# Patient Record
Sex: Male | Born: 1965
Health system: Southern US, Community
[De-identification: ages and names within clinical notes are randomized; demographics above are authoritative.]

## PROBLEM LIST (undated history)

## (undated) DIAGNOSIS — N529 Male erectile dysfunction, unspecified: Secondary | ICD-10-CM

## (undated) DIAGNOSIS — E119 Type 2 diabetes mellitus without complications: Secondary | ICD-10-CM

## (undated) DIAGNOSIS — R809 Proteinuria, unspecified: Secondary | ICD-10-CM

## (undated) HISTORY — DX: Type 2 diabetes mellitus without complications: E11.9

## (undated) HISTORY — DX: Male erectile dysfunction, unspecified: N52.9

---
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
## 1993-11-12 HISTORY — PX: WRIST SURGERY: SHX841

## 1995-02-17 DIAGNOSIS — IMO0001 Reserved for inherently not codable concepts without codable children: Secondary | ICD-10-CM | POA: Insufficient documentation

## 2004-11-12 DIAGNOSIS — R809 Proteinuria, unspecified: Secondary | ICD-10-CM

## 2004-11-12 HISTORY — DX: Proteinuria, unspecified: R80.9

## 2008-11-12 HISTORY — PX: SHOULDER SURGERY: SHX246

## 2009-08-17 ENCOUNTER — Ambulatory Visit: Payer: Self-pay | Admitting: General Practice

## 2009-08-19 ENCOUNTER — Ambulatory Visit: Payer: Self-pay | Admitting: General Practice

## 2009-09-08 ENCOUNTER — Ambulatory Visit: Payer: Self-pay | Admitting: Orthopedic Surgery

## 2009-09-15 ENCOUNTER — Ambulatory Visit: Payer: Self-pay | Admitting: Orthopedic Surgery

## 2010-03-31 ENCOUNTER — Emergency Department: Payer: Self-pay

## 2015-01-26 ENCOUNTER — Ambulatory Visit: Payer: Self-pay | Admitting: Podiatry

## 2015-02-10 ENCOUNTER — Ambulatory Visit
Admit: 2015-02-10 | Disposition: A | Discharge status: Home / Self Care | Payer: Self-pay | Attending: General Practice | Admitting: General Practice

## 2015-02-11 ENCOUNTER — Ambulatory Visit
Admit: 2015-02-11 | Disposition: A | Discharge status: Home / Self Care | Payer: Self-pay | Attending: General Practice | Admitting: General Practice

## 2015-02-17 ENCOUNTER — Encounter: Payer: Self-pay | Admitting: General Surgery

## 2015-02-17 ENCOUNTER — Ambulatory Visit
Admit: 2015-02-17 | Disposition: A | Discharge status: Home / Self Care | Payer: Self-pay | Attending: General Surgery | Admitting: General Surgery

## 2015-02-17 ENCOUNTER — Other Ambulatory Visit: Payer: Self-pay | Admitting: General Surgery

## 2015-02-17 ENCOUNTER — Ambulatory Visit (INDEPENDENT_AMBULATORY_CARE_PROVIDER_SITE_OTHER): Payer: BLUE CROSS/BLUE SHIELD | Admitting: General Surgery

## 2015-02-17 VITALS — BP 130/70 | HR 80 | Resp 12 | Wt 190.0 lb

## 2015-02-17 DIAGNOSIS — E119 Type 2 diabetes mellitus without complications: Secondary | ICD-10-CM

## 2015-02-17 DIAGNOSIS — K819 Cholecystitis, unspecified: Secondary | ICD-10-CM

## 2015-02-17 DIAGNOSIS — Z794 Long term (current) use of insulin: Secondary | ICD-10-CM | POA: Diagnosis not present

## 2015-02-17 DIAGNOSIS — IMO0001 Reserved for inherently not codable concepts without codable children: Secondary | ICD-10-CM

## 2015-02-17 LAB — BASIC METABOLIC PANEL
Anion Gap: 7 (ref 7–16)
BUN: 17 mg/dL
CHLORIDE: 104 mmol/L
CREATININE: 1.07 mg/dL
Calcium, Total: 8.9 mg/dL
Co2: 29 mmol/L
EGFR (African American): >60
GLUCOSE: 77 mg/dL
POTASSIUM: 3.6 mmol/L
Sodium: 140 mmol/L

## 2015-02-17 LAB — CBC WITH DIFFERENTIAL/PLATELET
Basophil #: 0 10*3/uL (ref 0.0–0.1)
Basophil %: 0.1 %
EOS PCT: 4.8 %
Eosinophil #: 0.5 10*3/uL (ref 0.0–0.7)
HCT: 46.2 % (ref 40.0–52.0)
HGB: 15.7 g/dL (ref 13.0–18.0)
LYMPHS PCT: 30.5 %
Lymphocyte #: 2.9 10*3/uL (ref 1.0–3.6)
MCH: 31.1 pg (ref 26.0–34.0)
MCHC: 33.9 g/dL (ref 32.0–36.0)
MCV: 92 fL (ref 80–100)
MONOS PCT: 10.3 %
Monocyte #: 1 x10 3/mm (ref 0.2–1.0)
Neutrophil #: 5.2 10*3/uL (ref 1.4–6.5)
Neutrophil %: 54.3 %
Platelet: 218 10*3/uL (ref 150–440)
RBC: 5.05 10*6/uL (ref 4.40–5.90)
RDW: 13.6 % (ref 11.5–14.5)
WBC: 9.7 10*3/uL (ref 3.8–10.6)

## 2015-02-17 NOTE — Patient Instructions (Signed)

## 2015-02-17 NOTE — Progress Notes (Signed)
Patient ID: Craig Banks, male   DOB: 03/22/1966, 49 y.o.   MRN: [id number]  Chief Complaint  Patient presents with  . Other    Gallbladder    HPI Craig Banks is a 49 y.o. male here today for a evaluation for his gallbladder. Patient states he has been having abdominal pain for about 3 or 4 months. The pain is located in the right upper quadrant and last for 3 to 4 hours. He states he feels bloated all the time.   Abdomen ultrasound on 02/10/15 and HIDA on 02/11/15. EKG on 3/39/16 came back normal.   The patient reports that the pain may come shortly after meals or up to an hour later. He has been awakened from sleep at night with epigastric and right upper quadrant pain.  He's made use of Tums without any appreciable relief of symptoms. He does report increased belching and flatulence.. He has been somewhat constipated since making use of increasing amounts of calcium based antacids as well as Norco for pain.  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  He did report straining the left chest last week when he was pulling a riding lawnmower out of the ditch. HPI   Past Medical History  Diagnosis Date  . Diabetes mellitus without complication     age 55    Past Surgical History  Procedure Laterality Date  . Wrist surgery Right 1995  . Shoulder surgery Left 2010    No family history on file.  Social History History  Substance Use Topics  . Smoking status: Never Smoker   . Smokeless tobacco: Not on file  . Alcohol Use: 0.0 oz/week    0 Standard drinks or equivalent per week    No Known Allergies  Current Outpatient Prescriptions  Medication Sig Dispense Refill  . BAYER CONTOUR NEXT TEST test strip   11  . calcipotriene-betamethasone (TACLONEX) ointment   0  . Calcium Carbonate Antacid (TUMS CHEWY DELIGHTS PO) Take by mouth as needed.    . clotrimazole-betamethasone (LOTRISONE) lotion   0  . etodolac (LODINE) 500 MG tablet Take  500 mg by mouth.   1  . HUMALOG 100 UNIT/ML injection Inject 5 Units into the skin once.   12  . Hydrocodone-Acetaminophen 5-300 MG TABS as needed.   0  . LANTUS 100 UNIT/ML injection Inject 7 Units into the skin at bedtime.   12  . VIAGRA 100 MG tablet See admin instructions.  12   No current facility-administered medications for this visit.    Review of Systems Review of Systems  Respiratory: Positive for chest tightness. Negative for apnea, choking, shortness of breath, wheezing and stridor.   Cardiovascular: Negative.   Gastrointestinal: Positive for nausea, vomiting, abdominal pain and constipation. Negative for diarrhea, blood in stool and anal bleeding.    Blood pressure 130/70, pulse 80, resp. rate 12, weight 190 lb (86.183 kg).  Physical Exam Physical Exam  Constitutional: He is oriented to person, place, and time. He appears well-developed and well-nourished.  Eyes: Conjunctivae are normal. No scleral icterus.  Neck: Neck supple.  Cardiovascular: Normal rate, regular rhythm and normal heart sounds.   Pulmonary/Chest: Effort normal and breath sounds normal.  Abdominal: Soft. Normal appearance and bowel sounds are normal. There is tenderness in the epigastric area. No hernia.  Lymphadenopathy:    He has no cervical adenopathy.  Neurological: He is alert and oriented to person, place, and time.  Skin: Skin is  warm and dry.    Data Reviewed Laboratory studies dated 04/15/2014 showed a hemoglobin of 15.7, white blood cell count 6400. TSH 6.14.  Normal electrolytes, normal liver function studies. Hemoglobin A1c 8.1.  ECG dated February 08, 2015 was normal.   Abdominal ultrasound dated 02/10/2015 was unremarkable.  HIDA scan with CCK injection completed 02/11/2015 showed an ejection fraction 18%. The patient did experience abdominal pain during the CCK injection.  Assessment    Symptoms compatible with chronic cholecystitis.    Plan    The patient has been diabetic  for 20 years, but his clinical history is not suggestive of gastric outlet obstruction or gastroparesis. His reported nocturnal pain and reproduction of symptoms during CCK point strongly towards a biliary source for his discomfort.  The possibility that pain may not be relieved after surgery was reviewed.    Laparoscopic Cholecystectomy with Intraoperative Cholangiogram. The procedure, including it's potential risks and complications (including but not limited to infection, bleeding, injury to intra-abdominal organs or bile ducts, bile leak, poor cosmetic result, sepsis and death) were discussed with the patient in detail. Non-operative options, including their inherent risks (acute calculous cholecystitis with possible choledocholithiasis or gallstone pancreatitis, with the risk of ascending cholangitis, sepsis, and death) were discussed as well. The patient expressed and understanding of what we discussed and wishes to proceed with laparoscopic cholecystectomy. The patient further understands that if it is technically not possible, or it is unsafe to proceed laparoscopically, that I will convert to an open cholecystectomy.    PCP:  Leigh AuroraStrickland, James  Byrnett, Jeffrey W 02/17/2015, 10:36 AM

## 2015-02-18 ENCOUNTER — Ambulatory Visit
Admit: 2015-02-18 | Disposition: A | Discharge status: Home / Self Care | Payer: Self-pay | Attending: General Surgery | Admitting: General Surgery

## 2015-02-18 DIAGNOSIS — K8010 Calculus of gallbladder with chronic cholecystitis without obstruction: Secondary | ICD-10-CM | POA: Diagnosis not present

## 2015-02-18 HISTORY — PX: CHOLECYSTECTOMY: SHX55

## 2015-02-21 ENCOUNTER — Encounter: Payer: Self-pay | Admitting: General Surgery

## 2015-02-22 ENCOUNTER — Encounter: Payer: Self-pay | Admitting: General Surgery

## 2015-03-01 ENCOUNTER — Ambulatory Visit (INDEPENDENT_AMBULATORY_CARE_PROVIDER_SITE_OTHER): Payer: BLUE CROSS/BLUE SHIELD | Admitting: General Surgery

## 2015-03-01 ENCOUNTER — Encounter: Payer: Self-pay | Admitting: General Surgery

## 2015-03-01 VITALS — BP 112/86 | HR 92 | Resp 16 | Ht 67.0 in | Wt 191.0 lb

## 2015-03-01 DIAGNOSIS — K819 Cholecystitis, unspecified: Secondary | ICD-10-CM

## 2015-03-01 NOTE — Patient Instructions (Addendum)
The patient is aware to call back for any questions or concerns. 2 Aleve twice a day as needed

## 2015-03-01 NOTE — Progress Notes (Signed)
Patient ID: Craig Banks, male   DOB: Mar 14, 1966, 49 y.o.   MRN: [id number]  Chief Complaint  Patient presents with  . Routine Post Op    gallbladder    HPI Craig Banks is a 49 y.o. male.  Here today for postoperative visit, laparoscopic cholecystectomy on 02-18-15. States he is doing well. Mild constipation, dulcolax has helped.   HPI  Past Medical History  Diagnosis Date  . Diabetes mellitus without complication     age 49    Past Surgical History  Procedure Laterality Date  . Wrist surgery Right 1995  . Shoulder surgery Left 2010  . Cholecystectomy  02/18/15    No family history on file.  Social History History  Substance Use Topics  . Smoking status: Never Smoker   . Smokeless tobacco: Not on file  . Alcohol Use: 0.0 oz/week    0 Standard drinks or equivalent per week    No Known Allergies  Current Outpatient Prescriptions  Medication Sig Dispense Refill  . BAYER CONTOUR NEXT TEST test strip   11  . bisacodyl (DULCOLAX) 5 MG EC tablet Take 5 mg by mouth daily as needed for moderate constipation.    . calcipotriene-betamethasone (TACLONEX) ointment   0  . Calcium Carbonate Antacid (TUMS CHEWY DELIGHTS PO) Take by mouth as needed.    . clotrimazole-betamethasone (LOTRISONE) lotion   0  . HUMALOG 100 UNIT/ML injection Inject 5 Units into the skin once.   12  . LANTUS 100 UNIT/ML injection Inject 7 Units into the skin at bedtime.   12  . VIAGRA 100 MG tablet See admin instructions.  12   No current facility-administered medications for this visit.    Review of Systems Review of Systems  Constitutional: Negative.   Respiratory: Negative.   Cardiovascular: Negative.     Blood pressure 112/86, pulse 92, resp. rate 16, height 5\' 7"  (1.702 m), weight 191 lb (86.637 kg).  Physical Exam Physical Exam  Constitutional: He is oriented to person, place, and time. He appears well-developed and well-nourished.  Neck: Neck supple.  Cardiovascular: Normal  rate, regular rhythm and normal heart sounds.   Pulmonary/Chest: Effort normal and breath sounds normal.  Abdominal: Soft. Normal appearance. There is no tenderness.  Incisions well healed.  Lymphadenopathy:    He has no cervical adenopathy.  Neurological: He is alert and oriented to person, place, and time.  Skin: Skin is warm and dry.    Data Reviewed Pathology showed chronic cholecystitis.  Assessment    Doing well status post cholecystectomy.  Symptomatic left rib discomfort.    Plan    The patient had MG his chest wall by pulling a lawn tractor out of the ditch prior to surgery. The discomfort is improving but is still noticeable.  He may benefit from a rib belt, but certainly local heat and oral anti-inflammatories will be of benefit.    2 Aleve twice a day as needed for left rib discomfort. Proper lifting technique demonstrated.   We will plan for a return to work on 03/07/2015. PCP:  Leigh AuroraStrickland, James   Shalaunda Weatherholtz W 03/01/2015, 9:49 AM

## 2015-03-07 LAB — SURGICAL PATHOLOGY

## 2015-03-13 NOTE — Op Note (Signed)
PATIENT NAME:  Craig HazardMUNDY, Darris T MR#:  [id number] DATE OF BIRTH:  1966-07-08  DATE OF PROCEDURE:  02/18/2015  PREOPERATIVE DIAGNOSIS: Chronic cholecystitis.   POSTOPERATIVE DIAGNOSIS: Chronic cholecystitis.   OPERATIVE PROCEDURE: Laparoscopic cholecystectomy with intraoperative cholangiograms.   SURGEON: Earline MayotteJeffrey W. Ellieanna Funderburg, MD   ANESTHESIA: General endotracheal under Yves DillPaul Carroll, MD.   ESTIMATED BLOOD LOSS: Less than 5 mL.   CLINICAL NOTE: This 49 year old male has had a several-week history of postprandial pain and nocturnal pain. Ultrasound was negative, but a HIDA scan showed a markedly depressed ejection fraction of 19% with reproduction of his symptoms. Plain films of the chest and abdomen showed no obvious pathology and no evidence of gastric distention (a possibility with a 20-year history of reported diabetes). With an otherwise unremarkable clinical history and presentation, he was felt to be a candidate for cholecystectomy.   OPERATIVE NOTE: With the patient under adequate general endotracheal anesthesia, the abdomen was prepped with ChloraPrep and draped. In Trendelenburg position, a Veress needle was placed through a transumbilical incision. After assuring intra-abdominal location with the hanging drop test, a 12 mm step port was expanded and inspection showed no evidence of injury from initial port placement. Examination of the abdomen showed minimal inflammation in the gallbladder. No abnormality of the visualized portion of the liver, stomach, small intestine, colon, or anterior abdominal wall. No free fluid.   An 11 mm XL port was placed in the epigastrium and two 5-mm step ports were placed laterally under direct vision. The gallbladder was placed on cephalad traction. The neck of the gallbladder was cleared. The cystic duct and cystic artery were immediately adjacent to one another. The Kumar clamp was placed and fluoroscopic cholangiograms completed using 11 mL of one-half  strength Conray 60. This showed prompt filling of the entire biliary tree and free flow into the duodenum and partial reflux into the pancreatic duct. The cystic duct and cystic artery were individually clipped and divided. The gallbladder was then removed from the liver bed making use of hook cautery dissection. It was delivered through the umbilical port site. Inspection showed slight prominence of the mucosa, but no evidence of stones. It was sent in formalin for routine histology. The right upper quadrant was irrigated with lactated Ringer solution. Inspection from the epigastric site showed no evidence of injury from initial port placement. The abdomen was then desufflated and ports removed under direct vision. The fascia of the umbilicus was approximated with an 0 Vicryl figure-of-eight suture. Skin incisions were closed with 4-0 Vicryl subcuticular sutures. Benzoin, Steri-Strips, Telfa, and Tegaderm dressing were applied.  The patient tolerated the procedure well and was taken to the recovery room in stable condition.   ____________________________ Earline MayotteJeffrey W. Gail Creekmore, MD jwb:ST D: 02/18/2015 20:40:31 ET T: 02/19/2015 02:17:31 ET JOB#: [id number]  cc: Earline MayotteJeffrey W. Yetunde Leis, MD, <Dictator> Quita SkyeJames D. Dorothey BasemanStrickland, MD Amalie Koran Brion AlimentW Keiron Iodice MD ELECTRONICALLY SIGNED 02/21/2015 10:38

## 2015-04-30 IMAGING — CR DG ABDOMEN 1V
1 series · 1 of 1 positions shown · non-contrast
Comparison: None.

CLINICAL DATA: Diabetes.  Gallbladder surgery.

EXAM:
ABDOMEN - 1 VIEW

[dxr abdomen ap only]
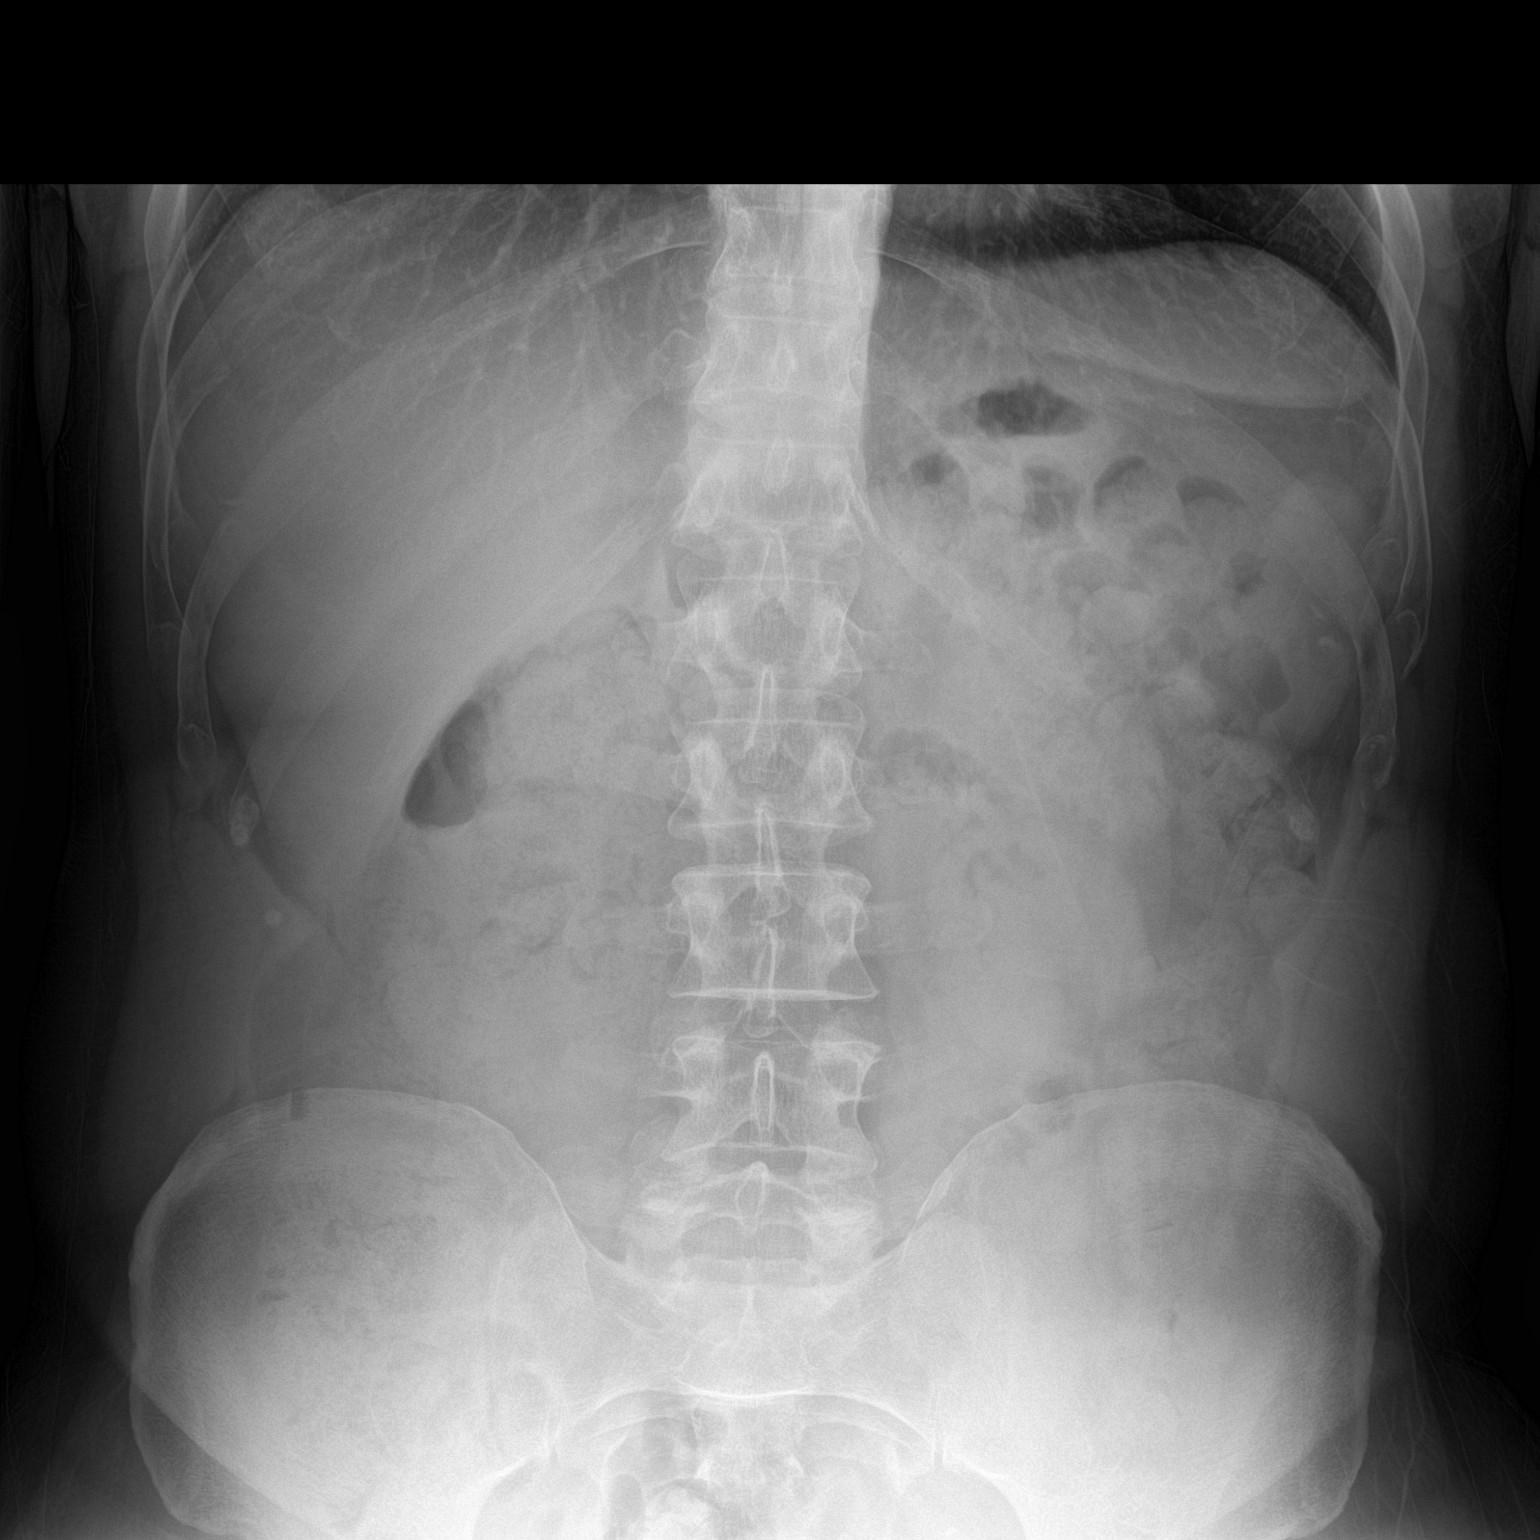

[1 of 1 positions shown; findings below may reference images not displayed]

FINDINGS: Soft tissue structures are unremarkable. No bowel distention. Large
amount stool noted throughout the colon. No free air. No acute bony
abnormality.
IMPRESSION: Large amount of stool noted throughout the colon suggesting
constipation. Exam is otherwise unremarkable.

## 2015-06-09 ENCOUNTER — Other Ambulatory Visit: Payer: Self-pay | Admitting: Physician Assistant

## 2019-05-14 ENCOUNTER — Other Ambulatory Visit: Payer: Self-pay

## 2019-05-14 ENCOUNTER — Ambulatory Visit: Payer: 59

## 2019-05-14 DIAGNOSIS — Z0000 Encounter for general adult medical examination without abnormal findings: Secondary | ICD-10-CM

## 2019-05-14 LAB — POCT URINALYSIS DIPSTICK
Bilirubin, UA: NEGATIVE
Blood, UA: NEGATIVE
Glucose, UA: NEGATIVE
Ketones, UA: NEGATIVE
Leukocytes, UA: NEGATIVE
Nitrite, UA: NEGATIVE
Protein, UA: POSITIVE — AB
Spec Grav, UA: 1.025 (ref 1.010–1.025)
Urobilinogen, UA: 1 E.U./dL
pH, UA: 6 (ref 5.0–8.0)

## 2019-05-15 LAB — CMP12+LP+TP+TSH+6AC+PSA+CBC…
ALT: 23 IU/L (ref 0–44)
Albumin/Globulin Ratio: 1.8 (ref 1.2–2.2)
Albumin: 4.2 g/dL (ref 3.8–4.9)
Alkaline Phosphatase: 79 IU/L (ref 39–117)
BUN/Creatinine Ratio: 21 — ABNORMAL HIGH (ref 9–20)
BUN: 24 mg/dL (ref 6–24)
Basophils Absolute: 0 10*3/uL (ref 0.0–0.2)
Basos: 0 %
Bilirubin Total: 0.5 mg/dL (ref 0.0–1.2)
Calcium: 9 mg/dL (ref 8.7–10.2)
Chloride: 105 mmol/L (ref 96–106)
Chol/HDL Ratio: 4.9 ratio (ref 0.0–5.0)
Cholesterol, Total: 230 mg/dL — ABNORMAL HIGH (ref 100–199)
Creatinine, Ser: 1.14 mg/dL (ref 0.76–1.27)
EOS (ABSOLUTE): 0.9 10*3/uL — ABNORMAL HIGH (ref 0.0–0.4)
Eos: 11 %
Estimated CHD Risk: 1 times avg. (ref 0.0–1.0)
Free Thyroxine Index: 2 (ref 1.2–4.9)
GFR calc Af Amer: 84 mL/min/{1.73_m2} (ref >59)
GFR calc non Af Amer: 73 mL/min/{1.73_m2} (ref >59)
GGT: 47 IU/L (ref 0–65)
Globulin, Total: 2.3 g/dL (ref 1.5–4.5)
Glucose: 131 mg/dL — ABNORMAL HIGH (ref 65–99)
HDL: 47 mg/dL (ref >39)
Hematocrit: 44.1 % (ref 37.5–51.0)
Hemoglobin: 15.4 g/dL (ref 13.0–17.7)
Immature Grans (Abs): 0 10*3/uL (ref 0.0–0.1)
Immature Granulocytes: 0 %
Iron: 64 ug/dL (ref 38–169)
LDH: 186 IU/L (ref 121–224)
LDL Calculated: 160 mg/dL — ABNORMAL HIGH (ref 0–99)
Lymphocytes Absolute: 1.9 10*3/uL (ref 0.7–3.1)
Lymphs: 24 %
MCH: 30.8 pg (ref 26.6–33.0)
MCHC: 34.9 g/dL (ref 31.5–35.7)
Monocytes Absolute: 0.8 10*3/uL (ref 0.1–0.9)
Monocytes: 10 %
Neutrophils Absolute: 4.5 10*3/uL (ref 1.4–7.0)
Neutrophils: 55 %
Phosphorus: 4.4 mg/dL — ABNORMAL HIGH (ref 2.8–4.1)
Platelets: 226 10*3/uL (ref 150–450)
Prostate Specific Ag, Serum: 0.7 ng/mL (ref 0.0–4.0)
RBC: 5 x10E6/uL (ref 4.14–5.80)
RDW: 13 % (ref 11.6–15.4)
Sodium: 143 mmol/L (ref 134–144)
T3 Uptake Ratio: 27 % (ref 24–39)
T4, Total: 7.3 ug/dL (ref 4.5–12.0)
TSH: 3 u[IU]/mL (ref 0.450–4.500)
Total Protein: 6.5 g/dL (ref 6.0–8.5)
Triglycerides: 114 mg/dL (ref 0–149)
Uric Acid: 4.3 mg/dL (ref 3.7–8.6)
VLDL Cholesterol Cal: 23 mg/dL (ref 5–40)
WBC: 8.2 10*3/uL (ref 3.4–10.8)

## 2019-05-15 LAB — CMP12+LP+TP+TSH+6AC+PSA+CBC?
AST: 18 IU/L (ref 0–40)
MCV: 88 fL (ref 79–97)
Potassium: 4.4 mmol/L (ref 3.5–5.2)

## 2019-05-15 LAB — MICROALBUMIN / CREATININE URINE RATIO
Creatinine, Urine: 219.9 mg/dL
Microalb/Creat Ratio: 4 mg/g creat (ref 0–29)
Microalbumin, Urine: 7.7 ug/mL

## 2019-05-15 LAB — HGB A1C W/O EAG: Hgb A1c MFr Bld: 8 % — ABNORMAL HIGH (ref 4.8–5.6)

## 2019-05-21 ENCOUNTER — Other Ambulatory Visit: Payer: Self-pay

## 2019-05-21 ENCOUNTER — Ambulatory Visit: Payer: Self-pay | Admitting: Internal Medicine

## 2019-05-21 ENCOUNTER — Encounter: Payer: Self-pay | Admitting: Internal Medicine

## 2019-05-21 VITALS — BP 129/87 | HR 107 | Temp 97.2°F | Resp 16 | Ht 67.5 in | Wt 209.0 lb

## 2019-05-21 DIAGNOSIS — E109 Type 1 diabetes mellitus without complications: Secondary | ICD-10-CM

## 2019-05-21 DIAGNOSIS — E7849 Other hyperlipidemia: Secondary | ICD-10-CM

## 2019-05-21 NOTE — Progress Notes (Signed)
S  - 53 y.o. male with  IDDM who presents for annual physical evaluation. He notes his sugars have been a little higher in recent past on checks, does check his sugars several times daily. Taking 15/7 of insulin in am, and 15/5 pre-supper  Admits not as strict with his diet recently, and stated can get his A1C back in the 7 range with more exercise and diet mod's when talked about adjusting his medication/dose to do so. He has not been exercising as much recent past with gyms closed and Covid restrictions.  No specific complaints, denies any recent CP, palpitations, SOB (except with wearing mask noted), abdominal pains, change in bowel habits and does note he often has to have a BM shortly after eating, no blood or dark/black stools, no vision changes, no recent fevers or sx's concerning for Covid, up once overnight at most to urinate,no hesitancy or frequency. No LE swelling,no numbness or tingling in legs or feet.   No tob hx  Meds reviewed - not taking Lipitor (prescribed in March for him at our visit, not start yet) Current Outpatient Medications on File Prior to Visit  Medication Sig Dispense Refill  . atorvastatin (LIPITOR) 10 MG tablet Take 10 mg by mouth at bedtime.    Marland Kitchen BAYER CONTOUR NEXT TEST test strip   11  . BD INSULIN SYRINGE U/F 31G X 5/16" 0.3 ML MISC USE AS DIRECTED   UP TO 6 INJECTIONS DAILY    . bisacodyl (DULCOLAX) 5 MG EC tablet Take 5 mg by mouth daily as needed for moderate constipation.    . calcipotriene-betamethasone (TACLONEX) ointment   0  . Calcium Carbonate Antacid (TUMS CHEWY DELIGHTS PO) Take by mouth as needed.    . clotrimazole-betamethasone (LOTRISONE) lotion   0  . Continuous Blood Gluc Sensor (FREESTYLE LIBRE 14 DAY SENSOR) MISC See admin instructions.    Marland Kitchen HUMALOG 100 UNIT/ML injection Inject 5 Units into the skin once.   12  . insulin glargine (LANTUS) 100 UNIT/ML injection Inject into the skin.    Marland Kitchen LANTUS 100 UNIT/ML injection Inject 7 Units into the  skin at bedtime.   12  . VIAGRA 100 MG tablet See admin instructions.  12   No current facility-administered medications on file prior to visit.     No Known Allergies  Social History   Tobacco Use  Smoking Status Never Smoker  Smokeless Tobacco Never Used    FH - No colon CA, S with diabetes  O - NAD, masked, sweaty, HR up some and he questioned if his sugar may be a little low (a carb drink then given in the office and felt better), asked about checking his BS and he noted not needed  BP 129/87 (BP Location: Right Arm, Patient Position: Sitting, Cuff Size: Normal)   Pulse (!) 107   Temp (!) 97.2 F (36.2 C) (Oral)   Resp 16   Ht 5' 7.5" (1.715 m)   Wt 209 lb (94.8 kg)   SpO2 95%   BMI 32.25 kg/m    Weight - 202 - May 2019  HEENT - sclera anicteric, PERRL, EOMI, conj - non-inj'ed, L EAC with mild cerumen, R EAC less cerumen and TM's clear (harder to completely visualize on L with cerumen) Neck - supple, no adenopathy, no TM, carotids without bruits Car - RRR without m/g/r Pulm- CTA without wheeze or rales Abd - soft, NT, ND, BS+, no obvious HSM, no masses Back - no CVA tenderness Ext -  no LE edema, no active joints GU - no swelling in inguinal/suprapubic region, NT, testicle and prostate exams deferred (without concerning sx's and after discussion on current recommendations for prostate CA screening including PSA tests) Neuro - affect was not flat, appropriate with conversation  Grossly non-focal with good strength on testing, sensation intact to LT in distal extremities, DTR's 2+ and = patella, Romberg neg, no pronator drift, good balance on one foot, good finger to nose   Monofilament testing of feet - neg  Labs reviewed - A1C - 8.0 (7.4 in March, 8.4-10/2018), TC - 230, LDL -160, PSA - 0.7, urine microalb - neg,  ECG reviewed - no concerning changes from prior ECG Colonoscopy screening discussed - not yet done ("thinking about it" noted last PE)  Ass/Plan 1.A/P -  IDDM - fairly well controlled (A1C was better in March,noted not as strict diet and less exercise since)  Continue current medications Need to start the statin with goal to keep LDL <70, and he noted he would Importance of diet modifications and regular aerobic exercise encouraged Above to help with weight control/weight loss also important (and not continue to gain weight) Continue to monitor blood sugars as doing Discussed why important to keep sugars very well controlled to help protect the heart, kidneys, and lessen future complications. Also not to have BS's below 50 as he noted he has had that happen in the past, as low as in the 30's and informed him that is dangerous and important to never get that low. Noted the pumps available and how they can be helpful in management and a consideration.  Needs routine eye examinations and recommended following up for regular assessments and importance of that.   Consider ACE in future, microalb negative and BP borderline readings. Would like BP closer to 120/80 and adding the Lipitor today was emphasized and over time, hope to add an ACE to help   2.  Hyperlipidemia - LDL and TC high  Educated on the role of medications, risk/benefits and a statin again encouraged and he noted he will start - 10mg  dose Importance of diet modifications also helpful Importance of some regular aerobic exercise encouraged, as helpful with this also Above to help with weight control/weight loss also very important   3. More sedentary recent past - increase activity rec'ed as above  4. Screening - rec'ed colonoscopy as over age 53 and is screening. He noted he has never had any concerns for this and hesitant. If he decides to pursue, will let us know and can help arrange.   F/u in 3 months, sooner prn

## 2019-07-03 ENCOUNTER — Other Ambulatory Visit: Payer: Self-pay | Admitting: Internal Medicine

## 2019-07-17 ENCOUNTER — Telehealth: Payer: Self-pay

## 2019-07-17 NOTE — Telephone Encounter (Signed)
Received prior authorization from [facility] for Lantus.  Contacted [employer] at [phone] & spoke with Jeannetta Nap.  Information given as requested.  States she's forwarding the request to a pharmacist for review & Holland Falling will notify us in 24 - 48 business hours.  AMD

## 2019-07-21 MED ORDER — BASAGLAR KWIKPEN 100 UNIT/ML ~~LOC~~ SOPN: 70.0000 [IU] | PEN_INJECTOR | Freq: Every day | SUBCUTANEOUS | 0 refills | Status: DC

## 2019-07-21 NOTE — Telephone Encounter (Signed)
[employer] faxed a denial letter for the prior authorization request for Craig Banks to remain on Lantus.  Letter says formulary alternatives are:  Health and safety inspector.    Please advise.  AMD

## 2019-07-21 NOTE — Telephone Encounter (Signed)
appt scheudled

## 2019-07-21 NOTE — Telephone Encounter (Signed)
Craig Banks,  Could you help with the transition to the equivalent dose of Levmir from Lantus as we did recently with another patient.  Thanks

## 2019-07-21 NOTE — Telephone Encounter (Signed)
Called and spoke to pt he would like Basaglar since its most like Lantus. I have sent rx looks like he should have had a physical in May but didn't have it. He will need physical scheduled and labs prior. Jess will call pt to schedule

## 2019-07-23 ENCOUNTER — Other Ambulatory Visit: Payer: Self-pay

## 2019-07-23 ENCOUNTER — Other Ambulatory Visit: Payer: Self-pay | Admitting: Internal Medicine

## 2019-07-23 MED ORDER — INSULIN ASPART 100 UNIT/ML ~~LOC~~ SOLN
SUBCUTANEOUS | 11 refills | Status: DC
Start: 2019-07-23 — End: 2020-03-08

## 2019-07-23 MED ORDER — FREESTYLE FREEDOM LITE W/DEVICE KIT: PACK | 0 refills | Status: DC

## 2019-07-23 MED ORDER — GLUCOSE BLOOD VI STRP: ORAL_STRIP | 12 refills | Status: DC

## 2019-07-27 ENCOUNTER — Other Ambulatory Visit: Payer: Self-pay

## 2019-07-27 ENCOUNTER — Telehealth: Payer: Self-pay

## 2019-07-27 DIAGNOSIS — E109 Type 1 diabetes mellitus without complications: Secondary | ICD-10-CM

## 2019-07-27 DIAGNOSIS — E1069 Type 1 diabetes mellitus with other specified complication: Secondary | ICD-10-CM

## 2019-07-27 MED ORDER — INSULIN DETEMIR 100 UNIT/ML ~~LOC~~ SOLN: SUBCUTANEOUS | 5 refills | Status: DC

## 2019-07-27 NOTE — Telephone Encounter (Signed)
Contacted Craig Banks to let him know Lantus changed to Levemir & prescription electronically sent to pharmacy.  AMD

## 2019-08-20 ENCOUNTER — Encounter: Payer: Self-pay | Admitting: Physician Assistant

## 2019-08-20 ENCOUNTER — Other Ambulatory Visit: Payer: Self-pay

## 2019-08-20 ENCOUNTER — Ambulatory Visit: Payer: 59 | Admitting: Physician Assistant

## 2019-08-20 VITALS — BP 110/81 | HR 98 | Temp 97.9°F | Resp 16 | Ht 68.0 in | Wt 205.0 lb

## 2019-08-20 DIAGNOSIS — E109 Type 1 diabetes mellitus without complications: Secondary | ICD-10-CM

## 2019-08-20 NOTE — Progress Notes (Signed)
Subjective:    Patient ID: Craig Banks, male    DOB: August 28, 1966, 53 y.o.   MRN: [id number]  HPI  3 mo F/U for 31 up M DM I  Diagnosed at 40 yo- sister ,mom and gramma are diabetic. Son at 11yo negative so far.   Slightly stressed right now because new insurance company is requiring changes in some of his med supplies Self-admitted "creature of habit"- reassured that it is commonly difficult to face such changes as he has been using varying doses of his current drugs for over 20 years.  Per insurance company Lantus should become Levamir and Humalog become Novalog.   He actually has supplies of previous Rx at home and wishes to use them until depleted because his [employer]ene[employer] has expired for the year would mean added expense to change now.  Today on arrival his BS was 38.Marland KitchenMarland Kitchen"I felt it dropping coming up the stairs". He was given chocolate candy . He did eat lunch but it is currently snack time and his apple and fruit cup are in the car. Reminded with his history is imperative to keep candy  packet in his pocket... "They call them lifesavers for a reason".  Discussed management with a somewhat wider window of control. He has had it as low as mid-up 20's and this is strongly discouraged.  He has recently suddenly cut out all his diet soft drinks and cut meals back to try to lose weight. Has not monitored morning glucose nor done any randoms   He reports aweight  high of 208-209 and has lost to 202 now and generally feels good  Elevated cholesterol but refuses statin   Realizes he should have colonoscopy but agrees to proceed.Prefers not to schedule with Lelon Frohlich today but agrees to do so in near future.  Review of Systems Labs reviewed    Objective:   Physical Exam Constitutional:      General: He is not in acute distress.    Appearance: Normal appearance. He is obese. He is not ill-appearing.  HENT:     Mouth/Throat:     Mouth: Mucous membranes are moist.  Eyes:     Extraocular  Movements: Extraocular movements intact.  Neck:     Musculoskeletal: Normal range of motion.  Cardiovascular:     Rate and Rhythm: Normal rate.  Pulmonary:     Effort: Pulmonary effort is normal.  Musculoskeletal: Normal range of motion.  Skin:    General: Skin is warm and dry.  Neurological:     Mental Status: He is alert.  Psychiatric:        Mood and Affect: Mood normal.        Behavior: Behavior normal.        VS reviewed   205 at 5'8"         Assessment & Plan:  Has had a Libre unit ordered in the past but apparently doesn't use it - and is encouraged that it would help with closer monitoring of "of the moment"  glucose levels. Has been offered and ordered a Freestyle unit ,as well as pen dosing systems which he has deferred...encouraged to consider that many if the upgraded systems help make disease management much easier and less cumbersome. Counselled re the need for re-focus on DM management-- after 24 years he has grown tired of it all-- but this can have dangerous repercussions.  He was willing to discuss the need for a screening colonoscopy - but not willing to be put  on the referral list for a call back to schedule same  Dietary focus with healthy choices and appropriate servings--  daily exercise--15 min brisk walk, increasing to 30 then 40 min EVERY day -  Home glucose checks for more accurate understanding of his day intake and exercise  RTC in 1 month for eight check and re-rval--if stable can move back to 3 mos DM checks to follow

## 2019-08-20 NOTE — Progress Notes (Signed)
Physical was 05/2019 with Dr. Roxan Hockey  A1c Now = 7.9  NFBS = 53 Chocolate candy given to help bring up. Said he could tell it was low because he was sweating.

## 2019-09-14 DIAGNOSIS — Z20828 Contact with and (suspected) exposure to other viral communicable diseases: Secondary | ICD-10-CM | POA: Diagnosis not present

## 2019-10-21 DIAGNOSIS — Z20828 Contact with and (suspected) exposure to other viral communicable diseases: Secondary | ICD-10-CM | POA: Diagnosis not present

## 2019-11-17 ENCOUNTER — Other Ambulatory Visit: Payer: Self-pay

## 2019-11-26 ENCOUNTER — Ambulatory Visit: Payer: Self-pay

## 2019-12-02 ENCOUNTER — Other Ambulatory Visit: Payer: Self-pay

## 2019-12-02 ENCOUNTER — Other Ambulatory Visit: Payer: 59

## 2019-12-02 DIAGNOSIS — E109 Type 1 diabetes mellitus without complications: Secondary | ICD-10-CM

## 2019-12-02 NOTE — Progress Notes (Signed)
Scheduled to review lab results 12/09/2019 with Ron Smith, PA-C (Interim Provider).  AMD 

## 2019-12-03 LAB — BUN+CREAT
BUN/Creatinine Ratio: 14 (ref 9–20)
BUN: 15 mg/dL (ref 6–24)
Creatinine, Ser: 1.08 mg/dL (ref 0.76–1.27)
GFR calc Af Amer: 90 mL/min/{1.73_m2} (ref >59)
GFR calc non Af Amer: 78 mL/min/{1.73_m2} (ref >59)

## 2019-12-03 LAB — HGB A1C W/O EAG: Hgb A1c MFr Bld: 7.8 % — ABNORMAL HIGH (ref 4.8–5.6)

## 2019-12-07 ENCOUNTER — Other Ambulatory Visit: Payer: Self-pay | Admitting: Internal Medicine

## 2019-12-08 ENCOUNTER — Encounter: Payer: Self-pay | Admitting: Internal Medicine

## 2019-12-08 NOTE — Telephone Encounter (Signed)
Last labs Jan 2021 HgbA1c 7.8 appt with [facility] provider tomorrow pending.  Electronic Rx for refill of libre glucose monitoring supplies sent to his pharmacy of choice.

## 2019-12-09 ENCOUNTER — Other Ambulatory Visit: Payer: Self-pay

## 2019-12-09 ENCOUNTER — Telehealth: Payer: Self-pay | Admitting: Registered Nurse

## 2019-12-09 ENCOUNTER — Ambulatory Visit: Payer: 59 | Admitting: Physician Assistant

## 2019-12-09 VITALS — BP 120/86 | HR 103 | Temp 98.6°F | Ht 68.0 in | Wt 207.4 lb

## 2019-12-09 DIAGNOSIS — E109 Type 1 diabetes mellitus without complications: Secondary | ICD-10-CM

## 2019-12-09 MED ORDER — GLUCOSE BLOOD VI STRP: ORAL_STRIP | 12 refills | Status: DC

## 2019-12-09 NOTE — Telephone Encounter (Signed)
Thayer Ohm is scheduled to see Ron today at 3:15 pm for 3 month DM follow-up.  Contacted [facility] & it would cost approximately $204.00/month for the Freestyle 14 Day Sensors - 1 sensor per box & 2 boxes per month.  [facility] doesn't accept the GoodRx card for this - name brand product.    AMD

## 2019-12-09 NOTE — Telephone Encounter (Signed)
Spoke to [employer] and patient has to fail using dexcom G6 system before another system would be approved.  Please call and ask patient if he prefers to cash pay for freestyle libre 14 day reader or if I should enter new Rx for new meter for him.

## 2019-12-09 NOTE — Progress Notes (Signed)
   Subjective: Medication refill    Patient ID: Craig Banks, male    DOB: Aug 17, 1966, 54 y.o.   MRN: [id number]  HPI Patient presents for hemoglobin A1c results and medication refill test strips.  Patient state he is having problems having insurance company approved the [employer] monitoring sensors.  Patient requested refill strips when he pursue other avenues to get the freestyle [place] monitor sensor.   Review of Systems Diabetes and hyperlipidemia.    Objective:   Physical Exam  Deferred      Assessment & Plan:  Discussed lab results with patient showing hemoglobin A1c has decreased from 8.0-7.8.  Patient advised continue previous medications and a prescription for test strips were sent to the pharmacy.  Advised to follow-up in 6 months.

## 2019-12-10 ENCOUNTER — Telehealth: Payer: Self-pay

## 2019-12-10 NOTE — Telephone Encounter (Signed)
Erroneous entry  AMD 

## 2019-12-13 NOTE — Telephone Encounter (Signed)
noted 

## 2019-12-17 ENCOUNTER — Telehealth: Payer: Self-pay

## 2019-12-17 MED ORDER — FREESTYLE LIBRE 14 DAY SENSOR MISC: 2 refills | Status: DC

## 2019-12-17 NOTE — Telephone Encounter (Signed)
Patient called and states the glucose sensor was sent to [facility] and they do not accept the goodrx card. Patient requested rx to be sent to [facility]. Sent refill to new pharmacy. Original rx was sent to [facility] on 12/08/2019.

## 2019-12-17 NOTE — Telephone Encounter (Signed)
Noted agree with plan of care Rx signed for freestyle libre 14 day sensors.

## 2020-03-08 ENCOUNTER — Other Ambulatory Visit: Payer: Self-pay

## 2020-03-08 ENCOUNTER — Ambulatory Visit: Payer: Self-pay | Admitting: Physician Assistant

## 2020-03-08 ENCOUNTER — Encounter: Payer: Self-pay | Admitting: Physician Assistant

## 2020-03-08 VITALS — BP 113/83 | HR 94 | Temp 98.1°F | Resp 16 | Ht 67.5 in | Wt 209.0 lb

## 2020-03-08 DIAGNOSIS — Z0184 Encounter for antibody response examination: Secondary | ICD-10-CM

## 2020-03-08 DIAGNOSIS — E109 Type 1 diabetes mellitus without complications: Secondary | ICD-10-CM

## 2020-03-08 LAB — POCT GLYCOSYLATED HEMOGLOBIN (HGB A1C): Hemoglobin A1C: 7.7 % — AB (ref 4.0–5.6)

## 2020-03-08 MED ORDER — INSULIN ASPART 100 UNIT/ML ~~LOC~~ SOLN: SUBCUTANEOUS | 11 refills | Status: DC

## 2020-03-08 MED ORDER — GLUCOSE BLOOD VI STRP: ORAL_STRIP | 12 refills | Status: AC

## 2020-03-08 MED ORDER — INSULIN DETEMIR 100 UNIT/ML ~~LOC~~ SOLN: SUBCUTANEOUS | 5 refills | Status: DC

## 2020-03-08 NOTE — Progress Notes (Signed)
   Subjective: Diabetes    Patient ID: Craig Banks, male    DOB: 08/08/1966, 54 y.o.   MRN: [id number]  HPI Patient presents for medication refill of labetalol, Levemir, and freestyle glucose test strips.  Patient states still having issues trying to obtain the freestyle libre sensor and reader.   Review of Systems     Diabetes and hyperlipidemia. Objective:   Physical Exam Hemoglobin A1c today was 7.7.       Assessment & Plan: Medication refill  Patient prescriptions for Lovenox, Levemir, test for strep today.  Patient advised follow-up in 3 months.

## 2020-03-08 NOTE — Progress Notes (Signed)
A1c Now = 7.7  Needs Rx refill for Freestyle glucose test strips

## 2020-03-26 LAB — RABIES NEUT.ABS TITRAT.(RFFIT)

## 2020-04-08 DIAGNOSIS — E109 Type 1 diabetes mellitus without complications: Secondary | ICD-10-CM | POA: Diagnosis not present

## 2020-04-08 DIAGNOSIS — M109 Gout, unspecified: Secondary | ICD-10-CM | POA: Diagnosis not present

## 2020-04-08 DIAGNOSIS — M19079 Primary osteoarthritis, unspecified ankle and foot: Secondary | ICD-10-CM | POA: Diagnosis not present

## 2020-04-08 DIAGNOSIS — M25473 Effusion, unspecified ankle: Secondary | ICD-10-CM | POA: Diagnosis not present

## 2020-04-08 DIAGNOSIS — M25071 Hemarthrosis, right ankle: Secondary | ICD-10-CM | POA: Diagnosis not present

## 2020-04-08 DIAGNOSIS — M7989 Other specified soft tissue disorders: Secondary | ICD-10-CM | POA: Diagnosis not present

## 2020-04-08 DIAGNOSIS — M009 Pyogenic arthritis, unspecified: Secondary | ICD-10-CM | POA: Diagnosis not present

## 2020-04-08 DIAGNOSIS — M19071 Primary osteoarthritis, right ankle and foot: Secondary | ICD-10-CM | POA: Diagnosis not present

## 2020-05-31 ENCOUNTER — Other Ambulatory Visit: Payer: Self-pay

## 2020-05-31 DIAGNOSIS — E109 Type 1 diabetes mellitus without complications: Secondary | ICD-10-CM

## 2020-05-31 NOTE — Progress Notes (Signed)
Scheduled to complete physical 06/07/20 with Dr. Williams.  AMD 

## 2020-06-01 LAB — BUN+CREAT
BUN/Creatinine Ratio: 14 (ref 9–20)
BUN: 16 mg/dL (ref 6–24)
Creatinine, Ser: 1.17 mg/dL (ref 0.76–1.27)
GFR calc Af Amer: 81 mL/min/{1.73_m2} (ref >59)
GFR calc non Af Amer: 70 mL/min/{1.73_m2} (ref >59)

## 2020-06-01 LAB — HGB A1C W/O EAG: Hgb A1c MFr Bld: 9 % — ABNORMAL HIGH (ref 4.8–5.6)

## 2020-06-07 ENCOUNTER — Ambulatory Visit: Payer: 59 | Admitting: Emergency Medicine

## 2020-06-14 ENCOUNTER — Ambulatory Visit: Payer: Self-pay | Admitting: Emergency Medicine

## 2020-06-14 ENCOUNTER — Other Ambulatory Visit: Payer: Self-pay

## 2020-06-14 ENCOUNTER — Encounter: Payer: Self-pay | Admitting: Emergency Medicine

## 2020-06-14 DIAGNOSIS — E109 Type 1 diabetes mellitus without complications: Secondary | ICD-10-CM

## 2020-06-14 LAB — GLUCOSE, POCT (MANUAL RESULT ENTRY): POC Glucose: 106 mg/dl — AB (ref 70–99)

## 2020-06-14 MED ORDER — FREESTYLE LIBRE 14 DAY SENSOR MISC: 4 refills | Status: DC

## 2020-06-14 MED ORDER — LANTUS SOLOSTAR 100 UNIT/ML ~~LOC~~ SOPN: PEN_INJECTOR | SUBCUTANEOUS | 99 refills | Status: DC

## 2020-06-14 MED ORDER — INSULIN LISPRO 100 UNIT/ML ~~LOC~~ SOLN: SUBCUTANEOUS | 11 refills | Status: DC

## 2020-06-14 NOTE — Progress Notes (Signed)
Occupational Health Provider Note       Time seen: 3:49 PM    I have reviewed the vital signs and the nursing notes.  HISTORY   Chief Complaint Follow-up (lab results)   HPI Craig Banks is a 54 y.o. male with a history of diabetes who presents today for recheck regarding his diabetes. Prior to this visit he had another A1c done. He states occasionally he has had blood sugars in the 3 and 400 range. Recently has had some insurance changes which has affected the medications he could take and he felt like he had maintained his blood sugar well before these changes.  Past Medical History:  Diagnosis Date  . Diabetes ([place])   . Diabetes mellitus without complication ([place])    age 66  . Erectile dysfunction   . Proteinuria 2006    Past Surgical History:  Procedure Laterality Date  . CHOLECYSTECTOMY  02/18/15  . SHOULDER SURGERY Left 2010  . WRIST SURGERY Right 1995    Allergies Patient has no known allergies.   Review of Systems Constitutional: Negative for fever. Cardiovascular: Negative for chest pain. Respiratory: Negative for shortness of breath. Gastrointestinal: Negative for abdominal pain, vomiting and diarrhea. Musculoskeletal: Negative for back pain. Skin: Negative for rash. Neurological: Negative for headaches, focal weakness or numbness.  All systems negative/normal/unremarkable except as stated in the HPI  ____________________________________________   PHYSICAL EXAM:  VITAL SIGNS: Vitals:   06/14/20 1524  BP: 105/83  Pulse: (!) 103  Resp: 14  Temp: 98.3 F (36.8 C)  SpO2: 96%    Constitutional: Alert and oriented. Well appearing and in no distress. Eyes: Conjunctivae are normal. Normal extraocular movements. Cardiovascular: Normal rate, regular rhythm. No murmurs, rubs, or gallops. Respiratory: Normal respiratory effort without tachypnea nor retractions. Breath sounds are clear and equal bilaterally. No  wheezes/rales/rhonchi. Musculoskeletal: Nontender with normal range of motion in extremities. No lower extremity tenderness nor edema. Neurologic:  Normal speech and language. No gross focal neurologic deficits are appreciated.  Skin:  Skin is warm, dry and intact. No rash noted. Psychiatric: Speech and behavior are normal.  ____________________________________________   LABS (pertinent positives/negatives)  Recent Results (from the past 2160 hour(s))  Hgb A1c w/o eAG     Status: Abnormal   Collection Time: 05/31/20  3:32 PM  Result Value Ref Range   Hgb A1c MFr Bld 9.0 (H) 4.8 - 5.6 %    Comment:          Prediabetes: 5.7 - 6.4          Diabetes: >6.4          Glycemic control for adults with diabetes: <7.0   BUN+Creat     Status: None   Collection Time: 05/31/20  3:32 PM  Result Value Ref Range   BUN 16 6 - 24 mg/dL   Creatinine, Ser 1.17 0.76 - 1.27 mg/dL   GFR calc non Af Amer 70 >59 mL/min/1.73   GFR calc Af Amer 81 >59 mL/min/1.73    Comment: **[facility] currently reports eGFR in compliance with the current**   recommendations of the [employer]. [facility] will   update reporting as new guidelines are published from the NKF-ASN   Task force.    BUN/Creatinine Ratio 14 9 - 20  POCT glucose (manual entry)     Status: Abnormal   Collection Time: 06/14/20  3:30 PM  Result Value Ref Range   POC Glucose 106 (A) 70 - 99 mg/dl  ASSESSMENT AND PLAN  Diabetes   Plan: The patient had presented for reevaluation concerning his diabetes. Patient's labs indicate worsening control of his blood glucose. He will need endocrinology referral, I will attempt to reorder his insulin regimen. I will see him back in 6 months after he is evaluated by endocrinology. Lenise Arena MD    Note: This note was generated in part or whole with voice recognition software. Voice recognition is usually quite accurate but there are transcription errors that can and very often do occur.  I apologize for any typographical errors that were not detected and corrected.

## 2020-07-25 ENCOUNTER — Telehealth: Payer: Self-pay

## 2020-07-25 NOTE — Telephone Encounter (Signed)
Craig Banks called Friday (07/22/20) & spoke with Craig Rasmussen, RN about Lantus insulin.  States he spoke with Craig Banks Nurse, mental health) at [facility] to see if we can increase the dose to say up to 50 units qd prn so that he doesn't have to pay two co-pays.  Called Craig Banks (Pharmacist at [facility] [phone] & he explained that the Lantus pens come 1500 units per box.  50 units/day is a 30 day supply.  [name]'s Rx as currently written is for 42 units per day & will cause him to have to pay an extra co-pay.  Verbal authorization given to Craig Banks to change instructions to say "may use up to 50 units sq qd".  AMD

## 2020-08-11 DIAGNOSIS — Z20822 Contact with and (suspected) exposure to covid-19: Secondary | ICD-10-CM | POA: Diagnosis not present

## 2020-08-11 DIAGNOSIS — U071 COVID-19: Secondary | ICD-10-CM | POA: Diagnosis not present

## 2020-08-19 ENCOUNTER — Ambulatory Visit: Payer: 59

## 2020-12-13 ENCOUNTER — Other Ambulatory Visit: Payer: Self-pay | Admitting: Emergency Medicine

## 2020-12-13 DIAGNOSIS — E109 Type 1 diabetes mellitus without complications: Secondary | ICD-10-CM

## 2021-03-16 ENCOUNTER — Other Ambulatory Visit: Payer: Self-pay | Admitting: Emergency Medicine

## 2021-03-16 DIAGNOSIS — E109 Type 1 diabetes mellitus without complications: Secondary | ICD-10-CM

## 2021-03-28 ENCOUNTER — Ambulatory Visit: Payer: Self-pay | Admitting: Nurse Practitioner

## 2021-03-28 ENCOUNTER — Other Ambulatory Visit: Payer: Self-pay

## 2021-03-28 VITALS — BP 118/86 | HR 103 | Temp 97.6°F | Resp 14 | Ht 67.0 in | Wt 200.0 lb

## 2021-03-28 DIAGNOSIS — H00032 Abscess of right lower eyelid: Secondary | ICD-10-CM

## 2021-03-28 MED ORDER — CEPHALEXIN 500 MG PO CAPS: 500.0000 mg | ORAL_CAPSULE | Freq: Four times a day (QID) | ORAL | 0 refills | Status: AC

## 2021-03-28 MED ORDER — GENTAMICIN SULFATE 0.3 % OP SOLN: 2.0000 [drp] | OPHTHALMIC | 0 refills | Status: DC

## 2021-03-28 NOTE — Progress Notes (Signed)
   Subjective:    Patient ID: Craig Banks, male    DOB: [birth year], 55 y.o.   MRN: [id number]  HPI 55 year old male presenting with 10 days history of bump to right lower eyelid. He has been trying warm compresses at home and topical ointment without relief.   This also occurred about 3 weeks ago and resolved on his own with warm compresses.   Works in [employer]) denies any known bites.   Warm compress seems to irritate area.   Today's Vitals   03/28/21 0909  BP: 118/86  Pulse: (!) 103  Resp: 14  Temp: 97.6 F (36.4 C)  SpO2: 95%  Weight: 200 lb (90.7 kg)  Height: 5\' 7"  (1.702 m)   Body mass index is 31.32 kg/m.   Review of Systems  Constitutional: Negative.   HENT: Negative.   Eyes: Negative.   Skin: Positive for wound.       Objective:   Physical Exam HENT:     Head: Normocephalic.  Eyes:     General: Lids are normal. Lids are everted, no foreign bodies appreciated.        Right eye: No foreign body.     Conjunctiva/sclera:     Right eye: Right conjunctiva is injected.     Left eye: Left conjunctiva is not injected.      Comments: Erythematous papule to right lower lid with central yellow crusting. Tender to touch, temperature uniform no drainage present.   Neurological:     Mental Status: He is alert.           Assessment & Plan:  Attempted to drain abscess sanguinous drainage only with two needle aspiration attempts.   Likely infection as hordeolum was present prior to formation of abscess. Ddx inflammation secondary to hordeolum or bite.  Advised: ibuprofen 400mg  every 4 hours with food for inflammation Claritin once daily  Start Kelfex antibiotic & antibiotic eye drops   RTC in 48 hours if no improvement, earlier with any worsening symptoms as discussed.   Meds ordered this encounter  Medications  . gentamicin (GARAMYCIN) 0.3 % ophthalmic solution    Sig: Place 2 drops into the right eye every 4 (four) hours.    Dispense:  5 mL     Refill:  0  . cephALEXin (KEFLEX) 500 MG capsule    Sig: Take 1 capsule (500 mg total) by mouth 4 (four) times daily for 10 days.    Dispense:  40 capsule    Refill:  0

## 2021-04-07 NOTE — Progress Notes (Signed)
Pt scheduled to complete annual phsyical 04/19/21 with Mila Merry  CL,RMA

## 2021-04-11 ENCOUNTER — Other Ambulatory Visit: Payer: Self-pay

## 2021-04-11 ENCOUNTER — Ambulatory Visit: Payer: Self-pay

## 2021-04-11 DIAGNOSIS — Z0000 Encounter for general adult medical examination without abnormal findings: Secondary | ICD-10-CM

## 2021-04-11 LAB — POCT URINALYSIS DIPSTICK
Bilirubin, UA: NEGATIVE
Blood, UA: NEGATIVE
Glucose, UA: NEGATIVE
Ketones, UA: NEGATIVE
Leukocytes, UA: NEGATIVE
Nitrite, UA: NEGATIVE
Protein, UA: POSITIVE — AB
Spec Grav, UA: >=1.03 — AB (ref 1.010–1.025)
Urobilinogen, UA: 0.2 E.U./dL
pH, UA: 6 (ref 5.0–8.0)

## 2021-04-12 LAB — CMP12+LP+TP+TSH+6AC+PSA+CBC…
ALT: 24 IU/L (ref 0–44)
AST: 21 IU/L (ref 0–40)
Albumin/Globulin Ratio: 1.9 (ref 1.2–2.2)
Albumin: 4.1 g/dL (ref 3.8–4.9)
Alkaline Phosphatase: 80 IU/L (ref 44–121)
BUN/Creatinine Ratio: 18 (ref 9–20)
BUN: 19 mg/dL (ref 6–24)
Basophils Absolute: 0 10*3/uL (ref 0.0–0.2)
Basos: 0 %
Bilirubin Total: 0.5 mg/dL (ref 0.0–1.2)
Calcium: 8.7 mg/dL (ref 8.7–10.2)
Chloride: 103 mmol/L (ref 96–106)
Chol/HDL Ratio: 4.7 ratio (ref 0.0–5.0)
Cholesterol, Total: 205 mg/dL — ABNORMAL HIGH (ref 100–199)
Creatinine, Ser: 1.04 mg/dL (ref 0.76–1.27)
EOS (ABSOLUTE): 0.6 10*3/uL — ABNORMAL HIGH (ref 0.0–0.4)
Eos: 9 %
Estimated CHD Risk: 0.9 times avg. (ref 0.0–1.0)
Free Thyroxine Index: 2.1 (ref 1.2–4.9)
GGT: 32 IU/L (ref 0–65)
Globulin, Total: 2.2 g/dL (ref 1.5–4.5)
Glucose: 172 mg/dL — ABNORMAL HIGH (ref 65–99)
HDL: 44 mg/dL (ref >39)
Hematocrit: 43.3 % (ref 37.5–51.0)
Hemoglobin: 14.6 g/dL (ref 13.0–17.7)
Immature Grans (Abs): 0 10*3/uL (ref 0.0–0.1)
Immature Granulocytes: 1 %
Iron: 91 ug/dL (ref 38–169)
LDH: 196 IU/L (ref 121–224)
LDL Chol Calc (NIH): 146 mg/dL — ABNORMAL HIGH (ref 0–99)
Lymphocytes Absolute: 1.4 10*3/uL (ref 0.7–3.1)
Lymphs: 22 %
MCH: 30.2 pg (ref 26.6–33.0)
MCHC: 33.7 g/dL (ref 31.5–35.7)
MCV: 90 fL (ref 79–97)
Monocytes Absolute: 0.8 10*3/uL (ref 0.1–0.9)
Monocytes: 12 %
Neutrophils Absolute: 3.8 10*3/uL (ref 1.4–7.0)
Neutrophils: 56 %
Phosphorus: 3.6 mg/dL (ref 2.8–4.1)
Platelets: 240 10*3/uL (ref 150–450)
Potassium: 4.2 mmol/L (ref 3.5–5.2)
Prostate Specific Ag, Serum: 0.6 ng/mL (ref 0.0–4.0)
RBC: 4.84 x10E6/uL (ref 4.14–5.80)
RDW: 12.5 % (ref 11.6–15.4)
Sodium: 139 mmol/L (ref 134–144)
T3 Uptake Ratio: 29 % (ref 24–39)
T4, Total: 7.2 ug/dL (ref 4.5–12.0)
TSH: 4.8 u[IU]/mL — ABNORMAL HIGH (ref 0.450–4.500)
Total Protein: 6.3 g/dL (ref 6.0–8.5)
Triglycerides: 84 mg/dL (ref 0–149)
Uric Acid: 3.8 mg/dL (ref 3.8–8.4)
VLDL Cholesterol Cal: 15 mg/dL (ref 5–40)
WBC: 6.6 10*3/uL (ref 3.4–10.8)
eGFR: 85 mL/min/{1.73_m2} (ref >59)

## 2021-04-12 LAB — MICROALBUMIN / CREATININE URINE RATIO
Creatinine, Urine: 242.3 mg/dL
Microalb/Creat Ratio: 3 mg/g creat (ref 0–29)
Microalbumin, Urine: 6.5 ug/mL

## 2021-04-12 LAB — HGB A1C W/O EAG: Hgb A1c MFr Bld: 7.2 % — ABNORMAL HIGH (ref 4.8–5.6)

## 2021-04-19 ENCOUNTER — Ambulatory Visit: Payer: Self-pay | Admitting: Physician Assistant

## 2021-04-19 ENCOUNTER — Other Ambulatory Visit: Payer: Self-pay

## 2021-04-19 ENCOUNTER — Encounter: Payer: Self-pay | Admitting: Physician Assistant

## 2021-04-19 VITALS — BP 106/78 | HR 93 | Temp 98.4°F | Resp 14 | Ht 68.0 in | Wt 205.0 lb

## 2021-04-19 DIAGNOSIS — Z0000 Encounter for general adult medical examination without abnormal findings: Secondary | ICD-10-CM

## 2021-04-19 NOTE — Progress Notes (Signed)
Pt request refill on gentamicin ophthalmic solution.   Pt agrees to have an apt for eye exam. Pt agrees to have shingles vaccine. Pt agrees to having diabetic foot exam.  Pt denies wanting colonoscopy for right now.

## 2021-04-19 NOTE — Progress Notes (Signed)
   Subjective: Annual physical exam    Patient ID: Craig Banks, male    DOB: [birth year], 55 y.o.   MRN: [id number]  HPI Patient presented annual physical exam.  Voices no concerns or complaints.   Review of Systems Diabetes    Objective:   Physical Exam No acute distress.  Temperature is 98.4, pulse 93, respiration 14, BP is 106/78, patient is 96% O2 sat on room air.  Patient weighs 205 pounds.  BMI is 31.2. HEENT is unremarkable.  Neck is supple adenectomy or bruits.  Lungs clear to auscultation.  Heart regular rate and rhythm.  No acute findings on EKG. Abdomen with negative HSM, normoactive bowel sounds, soft, nontender to palpation. No obvious deformity to the upper or lower extremities.  Patient had full and equal range of motion upper and lower extremities. No obvious cervical or lumbar spine deformity.  Patient had full and equal range of motion cervical lumbar spine. Diabetic foot exam was unremarkable using filament test.  Nails are not hypertrophic. Cranial nerves II through XII grossly intact.  DTR 2+ without clonus.       Assessment & Plan: Well exam.  Discussed lab results with patient.  Recommend diabetic ophthalmology evaluation.

## 2021-06-12 ENCOUNTER — Other Ambulatory Visit: Payer: Self-pay | Admitting: Physician Assistant

## 2021-06-12 ENCOUNTER — Other Ambulatory Visit: Payer: Self-pay | Admitting: Emergency Medicine

## 2021-06-12 DIAGNOSIS — E109 Type 1 diabetes mellitus without complications: Secondary | ICD-10-CM

## 2021-06-12 MED ORDER — INSULIN LISPRO 100 UNIT/ML IJ SOLN: 10.0000 [IU] | Freq: Three times a day (TID) | INTRAMUSCULAR | 11 refills | Status: DC

## 2021-07-12 ENCOUNTER — Ambulatory Visit: Payer: 59 | Admitting: Physician Assistant

## 2021-07-20 ENCOUNTER — Ambulatory Visit: Payer: 59

## 2021-08-01 ENCOUNTER — Ambulatory Visit: Payer: Self-pay | Admitting: Physician Assistant

## 2021-08-01 ENCOUNTER — Other Ambulatory Visit: Payer: Self-pay

## 2021-08-01 ENCOUNTER — Encounter: Payer: Self-pay | Admitting: Physician Assistant

## 2021-08-01 VITALS — BP 131/86 | HR 103 | Temp 97.6°F | Resp 14 | Ht 67.0 in | Wt 195.0 lb

## 2021-08-01 DIAGNOSIS — E109 Type 1 diabetes mellitus without complications: Secondary | ICD-10-CM

## 2021-08-01 MED ORDER — FREESTYLE FREEDOM LITE W/DEVICE KIT: PACK | 11 refills | Status: AC

## 2021-08-01 NOTE — Progress Notes (Signed)
   Subjective: Diabetes    Patient ID: Craig Banks, male    DOB: [birth year], 55 y.o.   MRN: [id number]  HPI Patient here today to discuss denial of Humalog for diabetes.  Patient has been taking this medication for over 30 years type I diabetic.  Last year patient was denied by his insurance due to formulary issues.  Patient previous PCP requested reevaluation and it was granted for 1 year.  This year the patient has been denied Humalog again due to formulary issues.  Patient states he has tried NovoLog which is on the formulary in the past with not good results.  He does not feel comfortable restarting NovoLog.    Review of Systems Diabetes and erectile dysfunction. Constitutional: Negative for fever Cardiovascular: Negative for chest pain Respiratory: Negative for dyspnea Gastrointestinal: Negative abdominal pain, vomiting, and diarrhea. Skin: Negative for rash Neurological: Negative for headache or focal weakness.    Objective:   Physical Exam No acute distress.  Temperature 97.6, pulse 103, respiration 14, BP is 131/86, and patient is 95% O2 saturation on room air.  Patient weighs 195 pounds BMI is 30.54.  Last hemoglobin A1c 3 months ago was 7.2. HEENT unremarkable, neck is supple for adenopathy or bruits.  Lungs clear to auscultation.  Heart is regular rate and rhythm. Abdomen with negative HSM, normoactive bowel sounds, soft, nontender to palpation. Musculoskeletal with no obvious deformity of the upper or lower extremities.  Patient has full and equal range of motion upper lower extremities.   Neurological: Normal speech with no gross focal neurodeficit.    Assessment & Plan: Type 1 diabetes   Due to patient long-term use Humalog will refer to endocrinology for further evaluation for continuation of this medication.

## 2021-08-01 NOTE — Progress Notes (Signed)
Humalog 100 units Lantus 100 units

## 2021-08-02 NOTE — Addendum Note (Signed)
Addended by: Gardner Candle on: 08/02/2021 08:48 AM   Modules accepted: Orders

## 2021-08-23 ENCOUNTER — Other Ambulatory Visit: Payer: Self-pay

## 2021-08-23 DIAGNOSIS — E109 Type 1 diabetes mellitus without complications: Secondary | ICD-10-CM

## 2021-08-23 NOTE — Telephone Encounter (Signed)
[employer] didn't approve PA for Lantus & Humalog 100 units/ml.  [employer] alternatives:  Lantus - Formulary alternatives:  Basaglar or Levemir Humalog 100 units/ml - Formulary alternatives:  Fiasp or Novolog  Above information emailed to [employer] to choose which of the two alternatives he wants.  Spoke with Thayer Ohm today - alternatives as follows: Lantus - Basaglar Humalog - Novolog  Rx to be sent to [facility].  Rx for [employer] sent to Durward Parcel, PA-C.  AMD

## 2021-08-24 ENCOUNTER — Other Ambulatory Visit: Payer: Self-pay | Admitting: Physician Assistant

## 2021-08-24 MED ORDER — INSULIN ASPART 100 UNIT/ML IJ SOLN: INTRAMUSCULAR | 2 refills | Status: DC

## 2021-08-24 MED ORDER — INSULIN ASPART 100 UNIT/ML IJ SOLN: INTRAMUSCULAR | 11 refills | Status: DC

## 2021-08-24 MED ORDER — INSULIN ASPART PROT & ASPART (70-30 MIX) 100 UNIT/ML ~~LOC~~ SUSP: SUBCUTANEOUS | 11 refills | Status: DC

## 2021-08-24 MED ORDER — BASAGLAR KWIKPEN 100 UNIT/ML ~~LOC~~ SOPN: PEN_INJECTOR | SUBCUTANEOUS | 11 refills | Status: DC

## 2021-09-04 ENCOUNTER — Other Ambulatory Visit: Payer: Self-pay | Admitting: Physician Assistant

## 2021-09-04 MED ORDER — FREESTYLE LIBRE 3 SENSOR MISC: 1.0000 | 6 refills | Status: DC

## 2021-09-28 ENCOUNTER — Other Ambulatory Visit: Payer: Self-pay | Admitting: Physician Assistant

## 2021-09-28 DIAGNOSIS — E109 Type 1 diabetes mellitus without complications: Secondary | ICD-10-CM

## 2021-09-29 ENCOUNTER — Other Ambulatory Visit: Payer: Self-pay

## 2021-09-29 NOTE — Telephone Encounter (Signed)
Received Faxed Rx request from [facility] for BD Insulin Syringe U/F 31G x 5/16".  Upon entering Rx refill info into Epic, a box opened up for a pended refill request already in Epic that was sent electronically by [facility].  Re-routed the pended refill request to Durward Parcel, PA-C.  AMD

## 2021-10-16 ENCOUNTER — Telehealth: Payer: Self-pay

## 2021-10-16 ENCOUNTER — Encounter: Payer: Self-pay | Admitting: Physician Assistant

## 2021-10-16 ENCOUNTER — Other Ambulatory Visit: Payer: Self-pay

## 2021-10-16 ENCOUNTER — Ambulatory Visit: Payer: Self-pay | Admitting: Physician Assistant

## 2021-10-16 VITALS — BP 130/86 | HR 110 | Temp 98.0°F | Resp 16

## 2021-10-16 DIAGNOSIS — R059 Cough, unspecified: Secondary | ICD-10-CM

## 2021-10-16 LAB — POCT INFLUENZA A/B
Influenza A, POC: NEGATIVE
Influenza B, POC: NEGATIVE

## 2021-10-16 LAB — POC COVID19 BINAXNOW: SARS Coronavirus 2 Ag: NEGATIVE

## 2021-10-16 MED ORDER — HYDROCODONE BIT-HOMATROP MBR 5-1.5 MG/5ML PO SOLN: 5.0000 mL | Freq: Three times a day (TID) | ORAL | 0 refills | Status: DC | PRN

## 2021-10-16 NOTE — Telephone Encounter (Signed)
error 

## 2021-10-16 NOTE — Progress Notes (Signed)
   Subjective: Cough    Patient ID: Craig Banks, male    DOB: 07-10-1966, 55 y.o.   MRN: [id number]  HPI Patient presents for 1 week of a nonproductive cough.  Patient cough is worse at night interfering with sleeping.  Patient tested negative for COVID-19 and influenza.  Patient injections are up-to-date for viral illnesses.  Denies fever chills associated with complaint.   Review of Systems Diabetes and erectile dysfunction    Objective:   Physical Exam No acute distress.  Temperature 98, pulse of tachycardia 110, respirations 16, BP is 130/86, patient 90% O2 sat on room air. HEENT is unremarkable.  Neck is supple for lymphadenopathy or bruits.  Lungs are clear to auscultation.  Cough with deep inspirations.  Heart regular rate and rhythm.       Assessment & Plan: Cough   Patient given a prescription for Hycodan.  Advised to follow-up in 2 days if no improvement or worsening complaint.

## 2021-10-16 NOTE — Progress Notes (Signed)
Cough & Sinus drainage x2 days - occ productive cough Cough worse at night - wakes him up  Rapid Influenza A = Negative Rapid Influenza B = Negative Rapid Covid Test = Negative  AMD

## 2021-10-18 ENCOUNTER — Telehealth: Payer: 59 | Admitting: Physician Assistant

## 2021-10-18 ENCOUNTER — Other Ambulatory Visit: Payer: Self-pay

## 2021-10-18 ENCOUNTER — Other Ambulatory Visit: Payer: Self-pay | Admitting: Physician Assistant

## 2021-10-18 MED ORDER — ROSUVASTATIN CALCIUM 40 MG PO TABS
40.0000 mg | ORAL_TABLET | Freq: Every day | ORAL | 3 refills | Status: DC
  Filled 2021-10-18: qty 90, 90d supply, fill #0

## 2021-10-19 ENCOUNTER — Ambulatory Visit: Payer: 59 | Admitting: Physician Assistant

## 2021-10-30 ENCOUNTER — Encounter: Payer: Self-pay | Admitting: Physician Assistant

## 2021-10-30 ENCOUNTER — Ambulatory Visit: Payer: 59 | Admitting: Physician Assistant

## 2021-10-30 VITALS — BP 107/78 | HR 97 | Temp 99.0°F | Resp 16

## 2021-10-30 DIAGNOSIS — J0120 Acute ethmoidal sinusitis, unspecified: Secondary | ICD-10-CM

## 2021-10-30 MED ORDER — HYDROCOD POLST-CPM POLST ER 10-8 MG/5ML PO SUER: 5.0000 mL | Freq: Every evening | ORAL | 0 refills | Status: DC | PRN

## 2021-10-30 MED ORDER — PSEUDOEPHEDRINE HCL ER 120 MG PO TB12: 120.0000 mg | ORAL_TABLET | Freq: Two times a day (BID) | ORAL | 0 refills | Status: DC

## 2021-10-30 MED ORDER — BENZONATATE 200 MG PO CAPS: 200.0000 mg | ORAL_CAPSULE | Freq: Three times a day (TID) | ORAL | 0 refills | Status: DC | PRN

## 2021-10-30 MED ORDER — METHYLPREDNISOLONE 4 MG PO TBPK: ORAL_TABLET | ORAL | 0 refills | Status: DC

## 2021-10-30 MED ORDER — AMOXICILLIN-POT CLAVULANATE 875-125 MG PO TABS: 1.0000 | ORAL_TABLET | Freq: Two times a day (BID) | ORAL | 0 refills | Status: DC

## 2021-10-30 NOTE — Progress Notes (Signed)
Sneezing, Headache, Nasal drainage - Clear, Productive cough sometimes, Cough making abdominal muscles sore. S/Sx over 2 weeks. No facial pain/pressure No discomfort to ears except sometimes when blows nose or coughs Cough keeping him awake at night  Has been taking Mucinex & Delsym but states "it's not even touching it".   AMD Out of hycodan cough syrup

## 2021-10-30 NOTE — Progress Notes (Signed)
° °  Subjective: Sinus congestion and cough    Patient ID: Craig Banks, male    DOB: [birth year], 55 y.o.   MRN: [id number]  HPI Patient presents with 3 weeks of sinus congestion, frontal headache, coughing/sneezing spells, nasal drainage, and intermittent productive/nonproductive cough.  Patient also complain body aches and stomach pain secondary to coughing spells.  States upper teeth pain with this complaint.  No dental issues.  Denies fever associated complaint.  Denies recent travel or known contact with COVID-19.   Review of Systems Diabetes    Objective:   Physical Exam No acute distress.  Temperature 99, pulse 97, respiration 16, BP 107/78, patient 95% O2 sat on room air. HEENT remarkable edematous nasal turbinates with bilateral maxillary and frontal guardians.  Pharynx shows postnasal drainage.  Neck is supple without lymphadenopathy or bruits.  Lungs are clear to auscultation.  Heart regular rate and rhythm.      Assessment & Plan: Sinusitis.   Patient given prescription for Augmentin, Sudafed, Tessalon, Tussionex, and Medrol Dosepak.  Patient will follow-up on the 27th for reevaluation.

## 2021-11-03 ENCOUNTER — Other Ambulatory Visit: Payer: Self-pay

## 2021-11-08 ENCOUNTER — Ambulatory Visit: Payer: 59 | Admitting: Physician Assistant

## 2022-02-21 ENCOUNTER — Encounter: Payer: 59 | Admitting: Physician Assistant

## 2022-03-26 ENCOUNTER — Other Ambulatory Visit: Payer: Self-pay | Admitting: Physician Assistant

## 2022-03-26 DIAGNOSIS — E109 Type 1 diabetes mellitus without complications: Secondary | ICD-10-CM

## 2022-04-24 ENCOUNTER — Ambulatory Visit: Payer: Self-pay

## 2022-04-24 DIAGNOSIS — Z0000 Encounter for general adult medical examination without abnormal findings: Secondary | ICD-10-CM

## 2022-04-24 LAB — POCT URINALYSIS DIPSTICK
Bilirubin, UA: NEGATIVE
Blood, UA: NEGATIVE
Glucose, UA: NEGATIVE
Ketones, UA: NEGATIVE
Leukocytes, UA: NEGATIVE
Nitrite, UA: NEGATIVE
Protein, UA: NEGATIVE
Spec Grav, UA: 1.02 (ref 1.010–1.025)
Urobilinogen, UA: 0.2 E.U./dL
pH, UA: 6 (ref 5.0–8.0)

## 2022-04-24 NOTE — Progress Notes (Signed)
Pt presents today for physical labs, will return to clinic for scheduled physical.  

## 2022-04-25 LAB — CMP12+LP+TP+TSH+6AC+PSA+CBC…
ALT: 29 IU/L (ref 0–44)
AST: 26 IU/L (ref 0–40)
Albumin/Globulin Ratio: 1.7 (ref 1.2–2.2)
Albumin: 4 g/dL (ref 3.8–4.9)
Alkaline Phosphatase: 79 IU/L (ref 44–121)
BUN/Creatinine Ratio: 19 (ref 9–20)
BUN: 19 mg/dL (ref 6–24)
Basophils Absolute: 0 10*3/uL (ref 0.0–0.2)
Basos: 0 %
Bilirubin Total: 0.5 mg/dL (ref 0.0–1.2)
Calcium: 9.2 mg/dL (ref 8.7–10.2)
Chloride: 105 mmol/L (ref 96–106)
Chol/HDL Ratio: 4.7 ratio (ref 0.0–5.0)
Cholesterol, Total: 199 mg/dL (ref 100–199)
Creatinine, Ser: 1 mg/dL (ref 0.76–1.27)
EOS (ABSOLUTE): 0.9 10*3/uL — ABNORMAL HIGH (ref 0.0–0.4)
Eos: 12 %
Estimated CHD Risk: 1 times avg. (ref 0.0–1.0)
Free Thyroxine Index: 2 (ref 1.2–4.9)
GGT: 23 IU/L (ref 0–65)
Globulin, Total: 2.3 g/dL (ref 1.5–4.5)
Glucose: 91 mg/dL (ref 70–99)
HDL: 42 mg/dL (ref >39)
Hematocrit: 44.6 % (ref 37.5–51.0)
Hemoglobin: 14.9 g/dL (ref 13.0–17.7)
Immature Grans (Abs): 0 10*3/uL (ref 0.0–0.1)
Immature Granulocytes: 0 %
Iron: 85 ug/dL (ref 38–169)
LDH: 203 IU/L (ref 121–224)
LDL Chol Calc (NIH): 141 mg/dL — ABNORMAL HIGH (ref 0–99)
Lymphocytes Absolute: 1.7 10*3/uL (ref 0.7–3.1)
Lymphs: 22 %
MCH: 30.2 pg (ref 26.6–33.0)
MCHC: 33.4 g/dL (ref 31.5–35.7)
MCV: 91 fL (ref 79–97)
Monocytes Absolute: 0.7 10*3/uL (ref 0.1–0.9)
Monocytes: 9 %
Neutrophils Absolute: 4.4 10*3/uL (ref 1.4–7.0)
Neutrophils: 57 %
Phosphorus: 3.6 mg/dL (ref 2.8–4.1)
Platelets: 275 10*3/uL (ref 150–450)
Potassium: 4.5 mmol/L (ref 3.5–5.2)
Prostate Specific Ag, Serum: 0.6 ng/mL (ref 0.0–4.0)
RBC: 4.93 x10E6/uL (ref 4.14–5.80)
RDW: 12.7 % (ref 11.6–15.4)
Sodium: 141 mmol/L (ref 134–144)
T3 Uptake Ratio: 27 % (ref 24–39)
T4, Total: 7.5 ug/dL (ref 4.5–12.0)
TSH: 2.71 u[IU]/mL (ref 0.450–4.500)
Total Protein: 6.3 g/dL (ref 6.0–8.5)
Triglycerides: 86 mg/dL (ref 0–149)
Uric Acid: 3.7 mg/dL — ABNORMAL LOW (ref 3.8–8.4)
VLDL Cholesterol Cal: 16 mg/dL (ref 5–40)
WBC: 7.8 10*3/uL (ref 3.4–10.8)
eGFR: 88 mL/min/{1.73_m2} (ref >59)

## 2022-04-25 LAB — MICROALBUMIN / CREATININE URINE RATIO
Creatinine, Urine: 195.7 mg/dL
Microalb/Creat Ratio: 5 mg/g creat (ref 0–29)
Microalbumin, Urine: 9.8 ug/mL

## 2022-04-25 LAB — HGB A1C W/O EAG: Hgb A1c MFr Bld: 6.8 % — ABNORMAL HIGH (ref 4.8–5.6)

## 2022-05-03 ENCOUNTER — Encounter: Payer: Self-pay | Admitting: Physician Assistant

## 2022-05-03 ENCOUNTER — Ambulatory Visit: Payer: Self-pay | Admitting: Physician Assistant

## 2022-05-03 VITALS — BP 111/76 | HR 92 | Temp 97.8°F | Resp 14 | Ht 67.0 in | Wt 189.0 lb

## 2022-05-03 DIAGNOSIS — E109 Type 1 diabetes mellitus without complications: Secondary | ICD-10-CM

## 2022-05-03 DIAGNOSIS — Z0000 Encounter for general adult medical examination without abnormal findings: Secondary | ICD-10-CM

## 2022-05-03 NOTE — Progress Notes (Signed)
[facility]  ____________________________________________   None    (approximate)  I have reviewed the triage vital signs and the nursing notes.   HISTORY  Chief Complaint Annual Exam   HPI Craig Banks is a 56 y.o. male patient presents for annual physical exam.  Patient voiced no concerns or complaints.  Patient stated has not made remarkable lifestyle changes and has been monitoring his glucose status is staying in range per his libre 3 readings.         Past Medical History:  Diagnosis Date   Diabetes (HCC)    Diabetes mellitus without complication [facility])    age 10   Erectile dysfunction    Proteinuria 2006    Patient Active Problem List   Diagnosis Date Noted   Acalculous cholecystitis 02/17/2015   Insulin dependent diabetes mellitus 02/17/1995    Past Surgical History:  Procedure Laterality Date   CHOLECYSTECTOMY  02/18/15   SHOULDER SURGERY Left 2010   WRIST SURGERY Right 1995    Prior to Admission medications   Medication Sig Start Date End Date Taking? Authorizing Provider  aspirin EC 81 MG tablet Take 81 mg by mouth daily.   Yes [provider]  BD INSULIN SYRINGE U/F 31G X 5/16" 0.3 ML MISC USE AS DIRECTED UP TO SIX INJECTIONS DAILY 09/29/21  Yes Joni Reining, PA-C  Blood Glucose Monitoring Suppl (FREESTYLE FREEDOM LITE) w/Device KIT Use to test four times daily 08/01/21  Yes Joni Reining, PA-C  Continuous Blood Gluc Sensor (FREESTYLE LIBRE 3 SENSOR) MISC USE AS DIRECTED EVERY 14 DAYS 03/26/22  Yes Joni Reining, PA-C  glucose blood test strip Use as instructed 07/23/19  Yes Welford Roche D, MD  glucose blood test strip Use as instructed 03/08/20  Yes Joni Reining, PA-C  insulin aspart (NOVOLOG) 100 UNIT/ML injection Use as directed for sliding scale. 08/24/21  Yes Joni Reining, PA-C  Insulin Glargine [facility] KWIKPEN) 100 UNIT/ML 15 units A.M and 15 units P.M 08/24/21  Yes Joni Reining, PA-C  rosuvastatin (CRESTOR) 40 MG tablet Take 1 tablet (40 mg total) by mouth daily. Patient not taking: Reported on 05/03/2022 10/18/21   Joni Reining, PA-C  VIAGRA 100 MG tablet See admin instructions. 01/22/15  Yes [provider]    Allergies Patient has no known allergies.  Family History  Problem Relation Age of Onset   Cancer Mother    Diabetes Sister     Social History Social History   Tobacco Use   Smoking status: Never   Smokeless tobacco: Never  Vaping Use   Vaping Use: Never used  Substance Use Topics   Alcohol use: Yes    Alcohol/week: 0.0 standard drinks of alcohol   Drug use: No    Review of Systems Constitutional: No fever/chills Eyes: No visual changes. ENT: No sore throat. Cardiovascular: Denies chest pain. Respiratory: Denies shortness of breath. Gastrointestinal: No abdominal pain.  No nausea, no vomiting.  No diarrhea.  No constipation. Genitourinary: Negative for dysuria. Musculoskeletal: Negative for back pain. Skin: Negative for rash. Neurological: Negative for headaches, focal weakness or numbness.   ____________________________________________   PHYSICAL EXAM:  VITAL SIGNS: BP is 111/76, pulse 92, respiration 14, temperature 98.8, patient 95% O2 sat on room air.  Patient weighs on 85 pounds and BMI is 29.60. Constitutional: Alert and oriented. Well appearing and in no acute distress. Eyes: Conjunctivae are normal. PERRL. EOMI. Head: Atraumatic. Nose: No congestion/rhinnorhea. Mouth/Throat:  Mucous membranes are moist.  Oropharynx non-erythematous. Neck: No stridor.  No cervical spine tenderness to palpation. Hematological/Lymphatic/Immunilogical: No cervical lymphadenopathy. Cardiovascular: Normal rate, regular rhythm. Grossly normal heart sounds.  Good peripheral circulation. Respiratory: Normal respiratory effort.  No retractions. Lungs CTAB. Gastrointestinal: Soft and nontender. No distention. No abdominal  bruits. No CVA tenderness. Genitourinary: Deferred Musculoskeletal: No lower extremity tenderness nor edema.  No joint effusions. Neurologic:  Normal speech and language. No gross focal neurologic deficits are appreciated. No gait instability. Skin:  Skin is warm, dry and intact. No rash noted. Psychiatric: Mood and affect are normal. Speech and behavior are normal.  ____________________________________________   LABS       Component Ref Range & Units 9 d ago 1 yr ago 2 yr ago  Color, UA  yellow  dark yellow  Dark Yellow   Clarity, UA  clear  clear  Clear   Glucose, UA Negative Negative  Negative  Negative   Bilirubin, UA  neg  negative  Negative   Ketones, UA  neg  negative  Negative   Spec Grav, UA 1.010 - 1.025 1.020  >=1.030 Abnormal   1.025   Blood, UA  neg  negative  Negative   pH, UA 5.0 - 8.0 6.0  6.0  6.0   Protein, UA Negative Negative  Positive Abnormal  CM  Positive Abnormal  CM   Urobilinogen, UA 0.2 or 1.0 E.U./dL 0.2  0.2  1.0   Nitrite, UA  neg  negative  Negative   Leukocytes, UA Negative Negative  Negative  Negative   Appearance  dark  dark     Odor  none                       Other Results from 04/24/2022   Contains abnormal data Hgb A1c w/o eAG Order: [id number] Status: Final result    Visible to patient: No (inaccessible in MyChart)    Next appt: None    Dx: Routine adult health maintenance    0 Result Notes   1 HM Topic          Component Ref Range & Units 9 d ago (04/24/22) 1 yr ago (04/11/21) 1 yr ago (05/31/20) 2 yr ago (03/08/20) 2 yr ago (12/02/19) 2 yr ago (05/14/19)  Hgb A1c MFr Bld 4.8 - 5.6 % 6.8 High   7.2 High  CM  9.0 High  CM  7.7 Abnormal  R  7.8 High  CM  8.0 High  CM   Comment:          Prediabetes: 5.7 - 6.4           Diabetes: >6.4           Glycemic control for adults with diabetes: <7.0   HbA1c POC (<> result, manual entry)          Resulting Agency  [facility]  [facility]         Narrative Performed  by: Verdell Carmine Performed at:  [address] [employer]  [address], [place], [place]  [id number]  Lab Director: Jolene Schimke MD, Phone:  [phone]    Specimen Collected: 04/24/22 10:56 Last Resulted: 04/25/22 13:10          View All Conversations on this Encounter      CM=Additional comments  R=Reference range differs from displayed range        Back to Top HEMOGLOBIN A1C (Every 6 Months)  Next due on  10/24/2022  Address Topic         Microalbumin / creatinine urine ratio Order: [id number] Status: Final result    Visible to patient: No (inaccessible in MyChart)    Next appt: None    Dx: Routine adult health maintenance    0 Result Notes   1 HM Topic       Component Ref Range & Units 9 d ago 1 yr ago 2 yr ago  Creatinine, Urine Not Estab. mg/dL 086.5  784.6  962.9   Microalbumin, Urine Not Estab. ug/mL 9.8  6.5  7.7   Microalb/Creat Ratio 0 - 29 mg/g creat 5  3 CM  4 CM   Comment:                        Normal:                0 -  29                         Moderately increased: 30 - 300                         Severely increased:       >300   Resulting Agency  [facility]         Narrative Performed by: Verdell Carmine Performed at:  [address] [employer]  [address], [place], [place]  [id number]  Lab Director: Jolene Schimke MD, Phone:  [phone]    Specimen Collected: 04/24/22 10:56 Last Resulted: 04/25/22 13:10        View All Conversations on this Encounter      CM=Additional comments      Result Care Coordination   Patient Communication  Back to Top URINE MICROALBUMIN (Yearly)  Next due on 04/25/2023  Address Topic          Contains abnormal data CMP12+LP+TP+TSH+6AC+PSA+CBC. Order: [id number] Status: Final result    Visible to patient: No (inaccessible in MyChart)    Next appt: None    Dx: Routine adult health maintenance    0 Result Notes           Component Ref Range & Units 9 d ago (04/24/22) 1 yr ago (04/11/21) 1 yr  ago (05/31/20) 2 yr ago (12/02/19) 2 yr ago (05/14/19) 7 yr ago (02/17/15) 7 yr ago (02/17/15)  Glucose 70 - 99 mg/dL 91  956 High  R    387 High  R  77 R, CM    Uric Acid 3.8 - 8.4 mg/dL 3.7 Low   3.8 CM    4.3 R, CM     Comment:            Therapeutic target for gout patients: <6.0  BUN 6 - 24 mg/dL 19  19  16  15  24  17  R, CM    Creatinine, Ser 0.76 - 1.27 mg/dL 5.64  3.32  9.51  8.84  1.14  1.07 R, CM    eGFR >59 mL/min/1.73 88  85        BUN/Creatinine Ratio 9 - 20 19  18  14  14  21  High      Sodium 134 - 144 mmol/L 141  139    143  140 R, CM    Potassium 3.5 - 5.2 mmol/L 4.5  4.2    4.4  3.6 R, CM    Chloride  96 - 106 mmol/L 105  103    105  104 R, CM    Calcium 8.7 - 10.2 mg/dL 9.2  8.7    9.0  8.9 R, CM    Phosphorus 2.8 - 4.1 mg/dL 3.6  3.6    4.4 High      Total Protein 6.0 - 8.5 g/dL 6.3  6.3    6.5     Albumin 3.8 - 4.9 g/dL 4.0  4.1    4.2     Globulin, Total 1.5 - 4.5 g/dL 2.3  2.2    2.3     Albumin/Globulin Ratio 1.2 - 2.2 1.7  1.9    1.8     Bilirubin Total 0.0 - 1.2 mg/dL 0.5  0.5    0.5     Alkaline Phosphatase 44 - 121 IU/L 79  80    79 R     LDH 121 - 224 IU/L 203  196    186     AST 0 - 40 IU/L 26  21    18      ALT 0 - 44 IU/L 29  24    23      GGT 0 - 65 IU/L 23  32    47     Iron 38 - 169 ug/dL 85  91    64     Cholesterol, Total 100 - 199 mg/dL 846  962 High     952 High      Triglycerides 0 - 149 mg/dL 86  84    841     HDL >32 mg/dL 42  44    47     VLDL Cholesterol Cal 5 - 40 mg/dL 16  15    23      LDL Chol Calc (NIH) 0 - 99 mg/dL 440 High   102 High         Chol/HDL Ratio 0.0 - 5.0 ratio 4.7  4.7 CM    4.9 CM     Comment:                                   T. Chol/HDL Ratio                                              Men  Women                                1/2 Avg.Risk  3.4    3.3                                    Avg.Risk  5.0    4.4                                 2X Avg.Risk  9.6    7.1                                 3X Avg.Risk 23.4   11.0    Estimated CHD Risk 0.0 - 1.0  times avg. 1.0  0.9 CM    1.0 CM     Comment: The CHD Risk is based on the T. Chol/HDL ratio. Other  factors affect CHD Risk such as hypertension, smoking,  diabetes, severe obesity, and family history of  premature CHD.   TSH 0.450 - 4.500 uIU/mL 2.710  4.800 High     3.000     T4, Total 4.5 - 12.0 ug/dL 7.5  7.2    7.3     T3 Uptake Ratio 24 - 39 % 27  29    27      Free Thyroxine Index 1.2 - 4.9 2.0  2.1    2.0     Prostate Specific Ag, Serum 0.0 - 4.0 ng/mL 0.6  0.6 CM    0.7 CM     Comment: Roche ECLIA methodology.  According to the American Urological Association, Serum PSA should  decrease and remain at undetectable levels after radical  prostatectomy. The AUA defines biochemical recurrence as an initial  PSA value 0.2 ng/mL or greater followed by a subsequent confirmatory  PSA value 0.2 ng/mL or greater.  Values obtained with different assay methods or kits cannot be used  interchangeably. Results cannot be interpreted as absolute evidence  of the presence or absence of malignant disease.   WBC 3.4 - 10.8 x10E3/uL 7.8  6.6    8.2   9.7 R   RBC 4.14 - 5.80 x10E6/uL 4.93  4.84    5.00   5.05 R   Hemoglobin 13.0 - 17.7 g/dL 09.8  11.9    14.7     Hematocrit 37.5 - 51.0 % 44.6  43.3    44.1     MCV 79 - 97 fL 91  90    88   92 R   MCH 26.6 - 33.0 pg 30.2  30.2    30.8   31.1 R   MCHC 31.5 - 35.7 g/dL 82.9  56.2    13.0   86.5 R   RDW 11.6 - 15.4 % 12.7  12.5    13.0   13.6 R   Platelets 150 - 450 x10E3/uL 275  240    226   218 R   Neutrophils Not Estab. % 57  56    55   54.3 R   Lymphs Not Estab. % 22  22    24      Monocytes Not Estab. % 9  12    10      Eos Not Estab. % 12  9    11      Basos Not Estab. % 0  0    0     Neutrophils Absolute 1.4 - 7.0 x10E3/uL 4.4  3.8    4.5   5.2 R   Lymphocytes Absolute 0.7 - 3.1 x10E3/uL 1.7  1.4    1.9   2.9 R   Monocytes Absolute 0.1 - 0.9 x10E3/uL 0.7  0.8    0.8     EOS (ABSOLUTE) 0.0 - 0.4 x10E3/uL 0.9 High    0.6 High     0.9 High      Basophils Absolute 0.0 - 0.2 x10E3/uL 0.0  0.0    0.0   0.0 R   Immature Granulocytes Not Estab. % 0  1    0     Immature Grans (              ____________________________________________  EKG  Sinus  Rhythm 82 bpm -RSR(V1) -nondiagnostic.  PROBABLY NORMAL  ____________________________________________    ____________________________________________   INITIAL IMPRESSION / ASSESSMENT AND PLAN / ED COURSE  As part of my medical decision making, I reviewed the following data within the electronic MEDICAL RECORD NUMBER       Discussed lab results and EKG findings with patient.  Congratulated patient on remarkable improvement of his hyperlipidemia and diabetes.  Continue previous medications and follow-up as needed.   ____________________________________________   FINAL CLINICAL IMPRESSION(S) / ED DIAGNOSES  @[facility] @   ED Discharge Orders     None        Note:  This document was prepared using Dragon voice recognition software and may include unintentional dictation errors.

## 2022-07-03 ENCOUNTER — Other Ambulatory Visit: Payer: Self-pay | Admitting: Physician Assistant

## 2022-07-03 DIAGNOSIS — E109 Type 1 diabetes mellitus without complications: Secondary | ICD-10-CM

## 2022-07-06 ENCOUNTER — Telehealth: Payer: Self-pay

## 2022-07-12 NOTE — Telephone Encounter (Signed)
JB

## 2022-08-02 ENCOUNTER — Ambulatory Visit: Payer: 59 | Admitting: Physician Assistant

## 2022-08-23 ENCOUNTER — Ambulatory Visit: Payer: Self-pay | Admitting: Physician Assistant

## 2022-08-23 ENCOUNTER — Encounter: Payer: Self-pay | Admitting: Physician Assistant

## 2022-08-23 VITALS — BP 125/77 | HR 92 | Temp 97.8°F | Resp 14

## 2022-08-23 DIAGNOSIS — W540XXA Bitten by dog, initial encounter: Secondary | ICD-10-CM

## 2022-08-23 MED ORDER — AMOXICILLIN-POT CLAVULANATE 875-125 MG PO TABS: 1.0000 | ORAL_TABLET | Freq: Two times a day (BID) | ORAL | 0 refills | Status: DC

## 2022-08-23 NOTE — Progress Notes (Signed)
   Subjective: Dog bite    Patient ID: Craig Banks, male    DOB: 1965/12/15, 56 y.o.   MRN: [id number]  HPI Patient presents with a dog bite to the palmar aspect of the left fourth finger.  Patient also has a small wound puncture wound to the palmar aspect of the left fifth finger.  Bleeding is controlled by direct pressure.  Denies loss sensation or loss of function.  Patient rabies shots are up-to-date.  Patient tetanus shot is up-to-date.   Review of Systems Negative except for chief complaint    Objective:   Physical Exam No acute distress. BP is 125/77, pulse of 92, respiration 14, temperature 97.8, patient 96% O2 sat on room air.  There is a 1.5 cm laceration to the proximal phalanx palmar aspect of the fourth digit left hand.  Bleeding is controlled.  There is also a small puncture wound to the palmar aspect of the mid phalange fifth digit left hand.       Assessment & Plan: Dog bite  Discussed with patient the rationale for not closing the wound.  Area was copiously irrigated.  Nonadhesive dressing applied over pressure wrap.  Patient given discharge care instructions and prescription for Augmentin.  Patient will follow-up on 08/27/2022.

## 2022-08-23 NOTE — Progress Notes (Signed)
WC DOI: 08-23-22  Dog bit Left ring finger. Pt does have it bandaged up.

## 2022-08-27 ENCOUNTER — Encounter: Payer: Self-pay | Admitting: Physician Assistant

## 2022-08-27 ENCOUNTER — Ambulatory Visit: Payer: Self-pay | Admitting: Physician Assistant

## 2022-08-27 DIAGNOSIS — S61259D Open bite of unspecified finger without damage to nail, subsequent encounter: Secondary | ICD-10-CM

## 2022-08-27 NOTE — Progress Notes (Signed)
Pt presents today for follow up on [place]. DOI: 08/23/22. Left ring finger.

## 2022-08-27 NOTE — Progress Notes (Signed)
   Subjective: Dog bite left finger    Patient ID: Craig Banks, male    DOB: [birth year], 56 y.o.   MRN: [id number]  HPI Presents for reevaluation of dog bite to the palmar aspect of the fourth left finger.  Patient keep area clean and dry but states wound Is open with flexion.  States no signs of infection.  Is taking Augmentin as directed.  Review of Systems Negative except for complaint    Objective:   Physical Exam  Patient has a laceration secondary to dog bite to the palmar aspect of the proximal phalange fourth digit left hand.  No edema or erythema.  Neurovascular intact.  Full and equal range of motion.      Assessment & Plan: Dog bite fourth left finger  Steri-Strips were applied and area rebandaged.  Patient advised to follow-up on 08/30/2022 for reevaluation.

## 2022-08-30 ENCOUNTER — Ambulatory Visit: Payer: Self-pay | Admitting: Physician Assistant

## 2022-08-30 ENCOUNTER — Encounter: Payer: Self-pay | Admitting: Physician Assistant

## 2022-08-30 VITALS — BP 121/84 | HR 91 | Temp 98.0°F | Resp 12

## 2022-08-30 DIAGNOSIS — W540XXD Bitten by dog, subsequent encounter: Secondary | ICD-10-CM

## 2022-08-30 NOTE — Progress Notes (Signed)
   Subjective: Dog bite    Patient ID: Craig Banks, male    DOB: 11/30/1965, 56 y.o.   MRN: [id number]  HPI Patient is follow-up for dog bite to the left finger which occurred on 08/23/2022.  Wound continues to heal by granulation.  Patient continue antibiotics.  Denies loss of function.  Review of Systems Negative except for chief complaint    Objective:   Physical Exam Examination of the finger shows a 1.5 cm laceration palmar aspect of fourth digit left hand.  Wound is healing by granulation without signs of secondary infection.  Neurovascular intact with free Nikkel range of motion.       Assessment & Plan: Dog bite  Advise continue wound care take antibiotics as directed.  Return to clinic condition worsens.

## 2022-08-30 NOTE — Progress Notes (Signed)
States finger is OK Reports no issues. No S/Sx if infection  AMD

## 2022-09-10 ENCOUNTER — Other Ambulatory Visit: Payer: Self-pay | Admitting: Physician Assistant

## 2022-09-10 DIAGNOSIS — E109 Type 1 diabetes mellitus without complications: Secondary | ICD-10-CM

## 2022-09-11 ENCOUNTER — Other Ambulatory Visit: Payer: Self-pay | Admitting: Physician Assistant

## 2022-09-11 ENCOUNTER — Other Ambulatory Visit: Payer: Self-pay

## 2022-09-11 DIAGNOSIS — E109 Type 1 diabetes mellitus without complications: Secondary | ICD-10-CM

## 2022-09-11 MED ORDER — INSULIN ASPART 100 UNIT/ML IJ SOLN: INTRAMUSCULAR | 11 refills | Status: DC

## 2022-09-11 MED ORDER — BASAGLAR KWIKPEN 100 UNIT/ML ~~LOC~~ SOPN: PEN_INJECTOR | SUBCUTANEOUS | 11 refills | Status: DC

## 2022-09-11 MED ORDER — "INSULIN SYRINGE-NEEDLE U-100 31G X 5/16"" 0.3 ML MISC": 3 refills | Status: DC

## 2022-09-11 MED ORDER — FREESTYLE LIBRE 3 SENSOR MISC: 2 refills | Status: DC

## 2022-09-13 ENCOUNTER — Other Ambulatory Visit: Payer: Self-pay

## 2022-09-13 ENCOUNTER — Other Ambulatory Visit: Payer: Self-pay | Admitting: Physician Assistant

## 2022-09-13 DIAGNOSIS — E109 Type 1 diabetes mellitus without complications: Secondary | ICD-10-CM

## 2022-09-13 MED ORDER — BASAGLAR TEMPO PEN 100 UNIT/ML ~~LOC~~ SOPN: 15.0000 [IU] | PEN_INJECTOR | Freq: Two times a day (BID) | SUBCUTANEOUS | 3 refills | Status: DC

## 2022-09-13 MED ORDER — BASAGLAR KWIKPEN 100 UNIT/ML ~~LOC~~ SOPN: 15.0000 [IU] | PEN_INJECTOR | Freq: Two times a day (BID) | SUBCUTANEOUS | 3 refills | Status: DC

## 2022-11-20 ENCOUNTER — Other Ambulatory Visit: Payer: Self-pay | Admitting: Physician Assistant

## 2022-11-20 ENCOUNTER — Other Ambulatory Visit: Payer: Self-pay

## 2022-11-20 DIAGNOSIS — E109 Type 1 diabetes mellitus without complications: Secondary | ICD-10-CM

## 2022-11-20 MED ORDER — LANTUS SOLOSTAR 100 UNIT/ML ~~LOC~~ SOPN: 15.0000 [IU] | PEN_INJECTOR | Freq: Every day | SUBCUTANEOUS | 3 refills | Status: DC

## 2022-11-20 MED ORDER — LANTUS SOLOSTAR 100 UNIT/ML ~~LOC~~ SOPN: 15.0000 [IU] | PEN_INJECTOR | Freq: Two times a day (BID) | SUBCUTANEOUS | 3 refills | Status: DC

## 2022-12-01 ENCOUNTER — Other Ambulatory Visit: Payer: Self-pay | Admitting: Physician Assistant

## 2022-12-11 ENCOUNTER — Other Ambulatory Visit: Payer: Self-pay

## 2022-12-11 DIAGNOSIS — Z0184 Encounter for antibody response examination: Secondary | ICD-10-CM

## 2022-12-11 DIAGNOSIS — E109 Type 1 diabetes mellitus without complications: Secondary | ICD-10-CM

## 2022-12-11 NOTE — Progress Notes (Signed)
PT presents today to complete A1c and rabies titer. Results pending with [facility]

## 2022-12-18 ENCOUNTER — Ambulatory Visit: Payer: Self-pay | Admitting: Physician Assistant

## 2022-12-18 ENCOUNTER — Encounter: Payer: Self-pay | Admitting: Physician Assistant

## 2022-12-18 VITALS — BP 106/74 | HR 69 | Temp 98.2°F | Resp 14

## 2022-12-18 DIAGNOSIS — E109 Type 1 diabetes mellitus without complications: Secondary | ICD-10-CM

## 2022-12-18 DIAGNOSIS — F419 Anxiety disorder, unspecified: Secondary | ICD-10-CM

## 2022-12-18 NOTE — Progress Notes (Signed)
   Subjective: Diabetes    Patient ID: Craig Banks, male    DOB: 03-Dec-1965, 57 y.o.   MRN: [id number]  HPI Patient Comes to the clinic check lab results of hemoglobin A1c.  Patient chief complaint is stress and anxiety secondary to interpersonal relationships with his wife.  Patient stated wife is moved out and moved in with her father stating that she needs "space".  Patient states they still communicate and see each other.  Patient and wife is trying to seek counseling secondary to childhood trauma which has come to the surface.  Patient states this is the first time in the 9 years of marriage when there have not been together.  States continues to work but has episodes of anxiety and depression.  States he does not know whether to seek counseling because he did not know the outcome of their relationship.  Denies suicidal ideations or harm to others.  Patient stated just need to ventilate and feels better after talking about the subject.  Patient admits to noncompliance of diabetic medication for 2 to 3 months.  Patient that he believes noncompliance is secondary to realizing that he is having marital problems.   Review of Systems Diabetes    Objective:   Physical Exam  BP is 106/74, pulse 69, respiration 14, temperature 90.2, patient 97% O2 sat on room air. Patient appears anxious. Patient's hemoglobin A1c was 6.8 on April 24, 2022.  Patient hemoglobin A1c today is 7.2.     Assessment & Plan: Diabetes and stress  Patient offered counseling through our [employer] program but he deferred at this time.  Refuse medications at this time for anxiety and depression.  States he will be more compliant with his diabetic medication.  Follow-up as needed.

## 2022-12-18 NOTE — Progress Notes (Signed)
Here to follow up with provider with ongoing labs/meds.  No new complaints and stated taking meds and insulin as prescribed.  Pt showed nurse small superficial cuts on multiple spots of bilateral hands stated from working at home and noted uncovered.  Education given for washing hands and keeping any cuts covered until healed.

## 2022-12-19 ENCOUNTER — Ambulatory Visit: Payer: 59 | Admitting: Physician Assistant

## 2022-12-25 ENCOUNTER — Encounter: Payer: Self-pay | Admitting: Physician Assistant

## 2022-12-25 ENCOUNTER — Ambulatory Visit: Payer: Self-pay | Admitting: Physician Assistant

## 2022-12-25 VITALS — BP 115/76 | HR 89 | Temp 98.9°F | Resp 14 | Wt 173.0 lb

## 2022-12-25 DIAGNOSIS — F32A Depression, unspecified: Secondary | ICD-10-CM

## 2022-12-25 LAB — RABIES NEUT.ABS TITRAT.(RFFIT)

## 2022-12-25 LAB — HGB A1C W/O EAG: Hgb A1c MFr Bld: 7.2 % — ABNORMAL HIGH (ref 4.8–5.6)

## 2022-12-25 MED ORDER — TRAZODONE HCL 100 MG PO TABS: 100.0000 mg | ORAL_TABLET | Freq: Every day | ORAL | 0 refills | Status: DC

## 2022-12-25 NOTE — Progress Notes (Signed)
   Subjective: Anxiety, depression, and stress    Patient ID: Craig Banks, male    DOB: 1966-04-12, 57 y.o.   MRN: [id number]  HPI Patient presents for anxiety, depression, and stress secondary to marital problems.  Patient states situation is now interfering with his work.  Patient states decreased ability to interact with coworkers and customers.  Patient states significant other is seeking counseling and he has decided to also seek separate counseling.  Denies suicidal ideations or harm to others.  States he is not sleeping secondary to anxiety and the stress of not being productive at work..    Review of Systems Diabetes    Objective:   Physical Exam  BP is 115/76, pulse 89, respiration 14, temperature 98.9, patient 97% O2 sat on room air.  Patient with 173 pounds. Patient appears more depressed than previous visit.    Assessment & Plan: Anxiety/depression and stress.   Patient will follow-up in [facility].  Discussed rationale for not prescribing any antidepression/antianxiety medicine until after evaluation by EAP.  Patient given a prescription for trazodone 100 mg to aid in sleeping.

## 2022-12-25 NOTE — Progress Notes (Signed)
Here to meet with provider with ongoing stress issues.  C/O lack of sleep and lack of appetite.

## 2022-12-31 ENCOUNTER — Ambulatory Visit: Payer: Self-pay | Admitting: Physician Assistant

## 2022-12-31 ENCOUNTER — Encounter: Payer: Self-pay | Admitting: Physician Assistant

## 2022-12-31 VITALS — BP 107/81 | HR 96 | Temp 98.4°F | Resp 14 | Ht 68.0 in | Wt 174.0 lb

## 2022-12-31 DIAGNOSIS — Z0184 Encounter for antibody response examination: Secondary | ICD-10-CM

## 2022-12-31 DIAGNOSIS — F32A Depression, unspecified: Secondary | ICD-10-CM

## 2022-12-31 MED ORDER — RABIES VACCINE, PCEC IM SUSR
1.0000 mL | Freq: Once | INTRAMUSCULAR | Status: AC
  Administered 2022-12-31: 1 mL via INTRAMUSCULAR

## 2022-12-31 NOTE — Progress Notes (Signed)
   Subjective: Anxiety and depression    Patient ID: Craig Banks, male    DOB: 1966-06-28, 57 y.o.   MRN: [id number]  HPI Patient is follow-up for anxiety depression secondary to interpersonal relationship with his significant other.  Patient states come to the conclusion that the relationship is nonviable and has started to "move on" with his life.  States this weekend he slept without sleeping aids.  Denies need for anxiety/depression medications.  States after talking with family he hasdeferred EAP consultation.   Review of Systems Diabetes    Objective:   Physical Exam BP is 107/81, pulse 96, respiration 14, temperature 98.4, patient 90% O2 sat on room air.  Patient was on 74 pounds BMI is 26.46. Patient appears more relaxed secondary to come to terms with interpersonal relationship.       Assessment & Plan: Anxiety/ depression   Advised patient to follow-up as necessary.

## 2022-12-31 NOTE — Addendum Note (Signed)
Addended by: Hazle Coca on: 12/31/2022 09:36 AM   Modules accepted: Orders

## 2022-12-31 NOTE — Progress Notes (Signed)
Pt presents today for follow up talk with provider. Pt states he's doing ok".

## 2023-02-04 DIAGNOSIS — Z0000 Encounter for general adult medical examination without abnormal findings: Secondary | ICD-10-CM

## 2023-02-04 DIAGNOSIS — E109 Type 1 diabetes mellitus without complications: Secondary | ICD-10-CM

## 2023-02-07 ENCOUNTER — Encounter: Payer: 59 | Admitting: Physician Assistant

## 2023-02-19 ENCOUNTER — Other Ambulatory Visit: Payer: Self-pay | Admitting: Physician Assistant

## 2023-02-19 MED ORDER — TRAZODONE HCL 100 MG PO TABS: 100.0000 mg | ORAL_TABLET | Freq: Every day | ORAL | 0 refills | Status: DC

## 2023-04-19 ENCOUNTER — Ambulatory Visit: Payer: 59

## 2023-04-19 VITALS — Wt 170.0 lb

## 2023-04-19 DIAGNOSIS — Z0000 Encounter for general adult medical examination without abnormal findings: Secondary | ICD-10-CM

## 2023-04-19 LAB — POCT URINALYSIS DIPSTICK
Bilirubin, UA: NEGATIVE
Blood, UA: NEGATIVE
Glucose, UA: NEGATIVE
Ketones, UA: NEGATIVE
Leukocytes, UA: NEGATIVE
Nitrite, UA: NEGATIVE
Protein, UA: POSITIVE — AB
Spec Grav, UA: 1.015 (ref 1.010–1.025)
Urobilinogen, UA: 0.2 E.U./dL
pH, UA: 6 (ref 5.0–8.0)

## 2023-04-19 NOTE — Progress Notes (Signed)
Pt completed labs for physical. /CL,RMA 

## 2023-04-21 LAB — CMP12+LP+TP+TSH+6AC+PSA+CBC…
ALT: 20 IU/L (ref 0–44)
AST: 19 IU/L (ref 0–40)
Albumin/Globulin Ratio: 1.8 (ref 1.2–2.2)
Albumin: 4.1 g/dL (ref 3.8–4.9)
Alkaline Phosphatase: 79 IU/L (ref 44–121)
BUN/Creatinine Ratio: 14 (ref 9–20)
BUN: 15 mg/dL (ref 6–24)
Basophils Absolute: 0 10*3/uL (ref 0.0–0.2)
Basos: 0 %
Bilirubin Total: 0.5 mg/dL (ref 0.0–1.2)
Calcium: 9.2 mg/dL (ref 8.7–10.2)
Chloride: 102 mmol/L (ref 96–106)
Chol/HDL Ratio: 3.7 ratio (ref 0.0–5.0)
Cholesterol, Total: 208 mg/dL — ABNORMAL HIGH (ref 100–199)
Creatinine, Ser: 1.05 mg/dL (ref 0.76–1.27)
EOS (ABSOLUTE): 0.9 10*3/uL — ABNORMAL HIGH (ref 0.0–0.4)
Eos: 10 %
Estimated CHD Risk: 0.6 times avg. (ref 0.0–1.0)
Free Thyroxine Index: 1.8 (ref 1.2–4.9)
GGT: 20 IU/L (ref 0–65)
Globulin, Total: 2.3 g/dL (ref 1.5–4.5)
Glucose: 92 mg/dL (ref 70–99)
HDL: 56 mg/dL (ref >39)
Hematocrit: 44.9 % (ref 37.5–51.0)
Hemoglobin: 15.4 g/dL (ref 13.0–17.7)
Immature Grans (Abs): 0 10*3/uL (ref 0.0–0.1)
Immature Granulocytes: 0 %
Iron: 100 ug/dL (ref 38–169)
LDH: 189 IU/L (ref 121–224)
LDL Chol Calc (NIH): 141 mg/dL — ABNORMAL HIGH (ref 0–99)
Lymphocytes Absolute: 2.4 10*3/uL (ref 0.7–3.1)
Lymphs: 28 %
MCH: 30.9 pg (ref 26.6–33.0)
MCHC: 34.3 g/dL (ref 31.5–35.7)
MCV: 90 fL (ref 79–97)
Monocytes Absolute: 1 10*3/uL — ABNORMAL HIGH (ref 0.1–0.9)
Monocytes: 12 %
Neutrophils Absolute: 4.2 10*3/uL (ref 1.4–7.0)
Neutrophils: 50 %
Phosphorus: 3.3 mg/dL (ref 2.8–4.1)
Platelets: 262 10*3/uL (ref 150–450)
Potassium: 4 mmol/L (ref 3.5–5.2)
Prostate Specific Ag, Serum: 0.8 ng/mL (ref 0.0–4.0)
RBC: 4.98 x10E6/uL (ref 4.14–5.80)
RDW: 12.7 % (ref 11.6–15.4)
Sodium: 139 mmol/L (ref 134–144)
T3 Uptake Ratio: 29 % (ref 24–39)
T4, Total: 6.2 ug/dL (ref 4.5–12.0)
TSH: 3.28 u[IU]/mL (ref 0.450–4.500)
Total Protein: 6.4 g/dL (ref 6.0–8.5)
Triglycerides: 61 mg/dL (ref 0–149)
Uric Acid: 3.5 mg/dL — ABNORMAL LOW (ref 3.8–8.4)
VLDL Cholesterol Cal: 11 mg/dL (ref 5–40)
WBC: 8.5 10*3/uL (ref 3.4–10.8)
eGFR: 83 mL/min/{1.73_m2} (ref >59)

## 2023-04-21 LAB — MICROALBUMIN / CREATININE URINE RATIO
Creatinine, Urine: 229.1 mg/dL
Microalb/Creat Ratio: 8 mg/g creat (ref 0–29)
Microalbumin, Urine: 18.7 ug/mL

## 2023-04-21 LAB — HGB A1C W/O EAG: Hgb A1c MFr Bld: 7.5 % — ABNORMAL HIGH (ref 4.8–5.6)

## 2023-04-23 ENCOUNTER — Other Ambulatory Visit: Payer: Self-pay

## 2023-04-23 ENCOUNTER — Ambulatory Visit: Payer: Self-pay | Admitting: Adult Health

## 2023-04-23 VITALS — BP 123/75 | HR 61 | Temp 98.0°F | Resp 14 | Ht 67.0 in | Wt 174.0 lb

## 2023-04-23 DIAGNOSIS — N529 Male erectile dysfunction, unspecified: Secondary | ICD-10-CM

## 2023-04-23 DIAGNOSIS — E109 Type 1 diabetes mellitus without complications: Secondary | ICD-10-CM

## 2023-04-23 DIAGNOSIS — F32A Depression, unspecified: Secondary | ICD-10-CM

## 2023-04-23 DIAGNOSIS — Z0000 Encounter for general adult medical examination without abnormal findings: Secondary | ICD-10-CM

## 2023-04-23 MED ORDER — TRAZODONE HCL 100 MG PO TABS
100.0000 mg | ORAL_TABLET | Freq: Every day | ORAL | 0 refills | Status: DC
Start: 2023-04-23 — End: 2024-03-06

## 2023-04-23 MED ORDER — VIAGRA 100 MG PO TABS: 100.0000 mg | ORAL_TABLET | ORAL | 12 refills | Status: DC

## 2023-04-23 NOTE — Progress Notes (Signed)
Here for annual physical with [facility].  Denies complaints today.  Stated taking insulin and meds as ordered.

## 2023-04-23 NOTE — Progress Notes (Signed)
[facility] [address], [place] [address]   Office Visit Note  Patient Name: Craig Banks  [id number]  [id number]  Date of Service: 04/23/2023  Chief Complaint  Patient presents with   Annual Exam     HPI Pt is here for annual physical.  Craig Banks is a well-appearing 57 year old male.  He is currently separated from his wife of 10 years.  He has 1 son who is 93 years old who just completed graduate school.  He has a history remarkable for insulin-dependent diabetes.  He reports he has been on medication for about 30 years.  He denies any alcohol nicotine or illicit drug use.  He reports he has worked for [employer] for 23 years here with the city of [place].  He walks quite a bit and fishes.  He reports spending lots of time outside.  Labs are reviewed patient reports about a 40 pound weight loss since he and his wife split up.  He has been having trouble sleeping recently due to the anxiety of his recent separation and was prescribed trazodone 100 mg at bedtime. He is not sure this is working and is interested in something a little stronger possibly.     Current Medication:  Outpatient Encounter Medications as of 04/23/2023  Medication Sig Note   aspirin EC 81 MG tablet Take 81 mg by mouth daily.    Blood Glucose Monitoring Suppl (FREESTYLE FREEDOM LITE) w/Device KIT Use to test four times daily    Continuous Blood Gluc Sensor (FREESTYLE LIBRE 3 SENSOR) MISC USE AS DIRECTED EVERY 14 DAYS    glucose blood test strip Use as instructed    glucose blood test strip Use as instructed    insulin glargine (LANTUS SOLOSTAR) 100 UNIT/ML Solostar Pen Inject 15 Units into the skin 2 (two) times daily.    insulin lispro (HUMALOG) 100 UNIT/ML injection Inject 3 Units into the skin 3 (three) times daily before meals. SSI    Insulin Syringe-Needle U-100 (BD INSULIN SYRINGE U/F) 31G X 5/16" 0.3 ML MISC USE AS DIRECTED UP TO SIX INJECTIONS DAILY    [DISCONTINUED]  VIAGRA 100 MG tablet See admin instructions. 02/17/2015: Received from: External Pharmacy   traZODone (DESYREL) 100 MG tablet Take 1 tablet (100 mg total) by mouth at bedtime.    VIAGRA 100 MG tablet Take 1 tablet (100 mg total) by mouth See admin instructions.    [DISCONTINUED] traZODone (DESYREL) 100 MG tablet Take 1 tablet (100 mg total) by mouth at bedtime. (Patient not taking: Reported on 04/23/2023)    No facility-administered encounter medications on file as of 04/23/2023.      Medical History: Past Medical History:  Diagnosis Date   Diabetes (HCC)    Diabetes mellitus without complication (HCC)    age 56   Erectile dysfunction    Proteinuria 2006     Vital Signs: BP 123/75   Pulse 61   Temp 98 F (36.7 C)   Resp 14   Ht 5\' 7"  (1.702 m)   Wt 174 lb (78.9 kg)   SpO2 95%   BMI 27.25 kg/m    Review of Systems  Constitutional:  Negative for chills, fatigue and fever.  HENT:  Negative for congestion, postnasal drip and sore throat.   Eyes:  Negative for pain, redness and itching.  Respiratory:  Negative for cough.   Cardiovascular:  Negative for chest pain.  Gastrointestinal:  Negative for diarrhea, nausea and vomiting.  Physical Exam Vitals and nursing note reviewed.  Constitutional:      Appearance: Normal appearance.  HENT:     Head: Normocephalic.     Right Ear: Tympanic membrane and ear canal normal.     Left Ear: Tympanic membrane and ear canal normal.     Nose: Nose normal.     Mouth/Throat:     Mouth: Mucous membranes are moist.  Eyes:     Pupils: Pupils are equal, round, and reactive to light.  Pulmonary:     Effort: Pulmonary effort is normal.     Breath sounds: Normal breath sounds.  Musculoskeletal:        General: Normal range of motion.  Lymphadenopathy:     Cervical: No cervical adenopathy.  Neurological:     General: No focal deficit present.     Mental Status: He is alert and oriented to person, place, and time.  Psychiatric:         Behavior: Behavior normal.        Thought Content: Thought content normal.     Comments: Verbalizes he is upset about this marital situation.     Assessment/Plan: 1. Routine adult health maintenance Up to date on PHM.   2. Type 1 diabetes mellitus without complication (HCC) A1C is elevated at 7.5 currently.  He is going to increase his lantus to 17 units bid. Follow up for A1c recheck in 90 days.   3. Anxiety and depression Continue Trazadone at this time.  Follow up as needed.  - traZODone (DESYREL) 100 MG tablet; Take 1 tablet (100 mg total) by mouth at bedtime.  Dispense: 30 tablet; Refill: 0  4. Erectile dysfunction, unspecified erectile dysfunction type Refilled Viagra as requested.  - VIAGRA 100 MG tablet; Take 1 tablet (100 mg total) by mouth See admin instructions.  Dispense: 10 tablet; Refill: 12     General Counseling: Craig Banks verbalizes understanding of the findings of todays visit and agrees with plan of treatment. I have discussed any further diagnostic evaluation that may be needed or ordered today. We also reviewed his medications today. he has been encouraged to call the office with any questions or concerns that should arise related to todays visit.   No orders of the defined types were placed in this encounter.   Meds ordered this encounter  Medications   traZODone (DESYREL) 100 MG tablet    Sig: Take 1 tablet (100 mg total) by mouth at bedtime.    Dispense:  30 tablet    Refill:  0   VIAGRA 100 MG tablet    Sig: Take 1 tablet (100 mg total) by mouth See admin instructions.    Dispense:  10 tablet    Refill:  12    Time spent:30 Minutes    Johnna Acosta AGNP-C Nurse Practitioner

## 2023-04-29 ENCOUNTER — Other Ambulatory Visit: Payer: Self-pay | Admitting: Physician Assistant

## 2023-04-29 DIAGNOSIS — E109 Type 1 diabetes mellitus without complications: Secondary | ICD-10-CM

## 2023-05-13 ENCOUNTER — Other Ambulatory Visit: Payer: Self-pay

## 2023-05-13 DIAGNOSIS — R5383 Other fatigue: Secondary | ICD-10-CM

## 2023-05-13 NOTE — Progress Notes (Signed)
Called clinic on Friday (05/10/23) C/O decreased energy, decreased muscle mass & no "drive".  Requested to have testosterone level checked.  Reviewed with Durward Parcel, PA-C.  Agreed for Thayer Ohm to come in for a [employer] & he's available today.  Blood specimen obtained for Testosterone free & total.  AMD

## 2023-05-16 LAB — TESTOSTERONE,FREE AND TOTAL
Testosterone, Free: 22 pg/mL (ref 7.2–24.0)
Testosterone: 1000 ng/dL — ABNORMAL HIGH (ref 264–916)

## 2023-05-20 ENCOUNTER — Ambulatory Visit: Payer: Self-pay | Admitting: Physician Assistant

## 2023-05-20 ENCOUNTER — Telehealth: Payer: Self-pay

## 2023-05-20 ENCOUNTER — Encounter: Payer: Self-pay | Admitting: Physician Assistant

## 2023-05-20 DIAGNOSIS — F5101 Primary insomnia: Secondary | ICD-10-CM

## 2023-05-20 DIAGNOSIS — R7989 Other specified abnormal findings of blood chemistry: Secondary | ICD-10-CM

## 2023-05-20 MED ORDER — ZOLPIDEM TARTRATE 10 MG PO TABS: 10.0000 mg | ORAL_TABLET | Freq: Every evening | ORAL | 1 refills | Status: DC | PRN

## 2023-05-20 NOTE — Telephone Encounter (Signed)
Requested Urology referral be sent to [facility] fax at [phone].  He did not want the [facility] referral deleted in [facility].  He stated he will call [facility] to follow up appt.

## 2023-05-20 NOTE — Addendum Note (Signed)
Addended by: Hansel Feinstein on: 05/20/2023 09:15 AM   Modules accepted: Orders

## 2023-05-20 NOTE — Telephone Encounter (Signed)
Referral sent to [facility] confirmed fax at request.

## 2023-05-20 NOTE — Progress Notes (Signed)
Pt concerned with lab results. Last supplement pt took was back in [place], 1 month supply.

## 2023-05-20 NOTE — Progress Notes (Signed)
   Subjective: Elevated testosterone and insomnia    Patient ID: Craig Banks, male    DOB: November 21, 1965, 57 y.o.   MRN: [id number]  HPI Patient requests testosterone levels on 05/13/2023.  Patient is state he took supplements Starleen Arms) for 1 month in February 2024.  Patient did not have his levels drawn before taking supplement.  Patient discontinued medication after 1 month since he did not notice any improvement.  Patient complaining of fatigue and insomnia and requested new levels on 05/13/2023.  Testosterone was 1000 and free testosterone was 22.  Patient is adamant that he is not taking any supplements at this time.  Patient also states trazodone is not helping with his insomnia.  Patient currently taking 100 mg at bedtime and states still has trouble falling asleep.  Review of Systems Insulin-dependent diabetic    Objective:   Physical Exam Please exam was deferred.       Assessment & Plan: Elevated testosterone and insomnia.  Patient request consult to urologist.  Patient advised to discontinue trazodone and start Ambien.  Advised to follow-up in 2 weeks.

## 2023-06-05 ENCOUNTER — Ambulatory Visit: Payer: Self-pay | Admitting: Urology

## 2023-06-05 VITALS — BP 113/72 | HR 94 | Ht 67.0 in | Wt 174.0 lb

## 2023-06-05 DIAGNOSIS — E291 Testicular hypofunction: Secondary | ICD-10-CM | POA: Diagnosis not present

## 2023-06-05 DIAGNOSIS — N528 Other male erectile dysfunction: Secondary | ICD-10-CM | POA: Diagnosis not present

## 2023-06-05 DIAGNOSIS — R7989 Other specified abnormal findings of blood chemistry: Secondary | ICD-10-CM

## 2023-06-05 MED ORDER — NONFORMULARY OR COMPOUNDED ITEM: 0 refills | Status: DC

## 2023-06-05 NOTE — Patient Instructions (Signed)
Trimix Injection Training  The provider will prescribe the Trimix medication to [facility].  You will need to pick up the medication at:   [address]  [place], [place] [address] [phone]  Once you have the medication in hand you will need to call our office to schedule a morning ONLY appointment to have injection teaching. The medication will induce an erection. The medication is estimated at $80.00. This medication is time sensitive as it expires quickly.   You will need to have a normal blood pressure in order to be injected. Your blood pressure must be under 140/90. We will check your blood pressure multiple times to ensure the reading is correct, if you BP continues to be elevated we will need to reschedule your appointment.        

## 2023-06-05 NOTE — Progress Notes (Addendum)
Marcelle Overlie Plume,acting as a scribe for Vanna Scotland, MD.,have documented all relevant documentation on the behalf of Vanna Scotland, MD,as directed by  Vanna Scotland, MD while in the presence of Vanna Scotland, MD.  06/05/2023 11:36 AM   Craig Banks 02-26-1966 [id number]  Referring provider: Joni Reining, PA-C [address] [place],  [place] [address]  Chief Complaint  Patient presents with   New Patient (Initial Visit)    HPI: 57 year-old male who is referred for elevated testosterone.   His testosterone level was 1000. He did not have a H&H.   He has experienced issues with arousal and maintaining erections, which prompted the initial testosterone check.   He has tried sildenafil and tadalafil  in the past without significant improvement. He also mentioned using over-the-counter testosterone boosters last year but did not notice any changes.  This is different than what is recorded in the chart by his referring provider that indicates that he took a supplement called new test for a month in February 2024.  He is interested in exploring penile injection therapy as the next step for managing his ED.   PMH: Past Medical History:  Diagnosis Date   Diabetes (HCC)    Diabetes mellitus without complication [facility])    age 40   Erectile dysfunction    Proteinuria 2006    Surgical History: Past Surgical History:  Procedure Laterality Date   CHOLECYSTECTOMY  02/18/15   SHOULDER SURGERY Left 2010   WRIST SURGERY Right 1995    Home Medications:  Allergies as of 06/05/2023   No Known Allergies      Medication List        Accurate as of June 05, 2023 11:36 AM. If you have any questions, ask your nurse or doctor.          aspirin EC 81 MG tablet Take 81 mg by mouth daily.   FreeStyle Freedom Lite w/Device Kit Use to test four times daily   FreeStyle Libre 3 Sensor Misc USE AS DIRECTED EVERY 14 DAYS   glucose blood test strip Use as instructed    glucose blood test strip Use as instructed   insulin lispro 100 UNIT/ML injection Commonly known as: HUMALOG Inject 3 Units into the skin 3 (three) times daily before meals. SSI   Insulin Syringe-Needle U-100 31G X 5/16" 0.3 ML Misc Commonly known as: BD Insulin Syringe U/F USE AS DIRECTED UP TO SIX INJECTIONS DAILY   Lantus SoloStar 100 UNIT/ML Solostar Pen Generic drug: insulin glargine Inject 15 Units into the skin 2 (two) times daily.   NONFORMULARY OR COMPOUNDED ITEM Trimix (30/1/10)-(Pap/Phent/PGE)  Test Dose  1ml vial   Qty #3 Refills 0  [facility] [phone] Fax [phone]   traZODone 100 MG tablet Commonly known as: DESYREL Take 1 tablet (100 mg total) by mouth at bedtime.   Viagra 100 MG tablet Generic drug: sildenafil Take 1 tablet (100 mg total) by mouth See admin instructions.   zolpidem 10 MG tablet Commonly known as: AMBIEN Take 1 tablet (10 mg total) by mouth at bedtime as needed for sleep.        Family History: Family History  Problem Relation Age of Onset   Cancer Mother    Diabetes Sister     Social History:  reports that he has never smoked. He has never used smokeless tobacco. He reports current alcohol use. He reports that he does not use drugs.   Physical Exam: BP 113/72  Pulse 94   Ht 5\' 7"  (1.702 m)   Wt 174 lb (78.9 kg)   BMI 27.25 kg/m   Constitutional:  Alert and oriented, No acute distress. HEENT: [place] AT, moist mucus membranes.  Trachea midline, no masses. Neurologic: Grossly intact, no focal deficits, moving all 4 extremities. Psychiatric: Normal mood and affect.   Assessment & Plan:    1. Elevated testosterone - Plan to check CBC to ensure hemoglobin and hematocrit levels are within normal limits. - Advised to avoid over-the-counter testosterone boosters. -This level may be physiologic for him, would not recommend any further intervention as long as his H&H is within normal limits  2. Erectile  dysfunction - Previous trials of sildenafil and tadalafil were ineffective. -He is having some difficulty clarifying the nature of his erectile dysfunction, may be related to underlying anxiety depression and other additional factors although does have risk factors for vascular related dysfunction including history of type 1 diabetes - Plan to initiate penile injection therapy with Trimix. -Also discussed penile prosthesis, not interested - Referral to [facility] in [place] for medication preparation. - Schedule follow-up appointment for injection teaching and dose titration.  Return for injection teaching with Doreatha Martin or Carollee Herter.   I have reviewed the above documentation for accuracy and completeness, and I agree with the above.   Vanna Scotland, MD    [facility] [address] [place], [place] [address] [phone]

## 2023-06-06 LAB — HEMOGLOBIN AND HEMATOCRIT, BLOOD
Hematocrit: 45 % (ref 37.5–51.0)
Hemoglobin: 15.6 g/dL (ref 13.0–17.7)

## 2023-07-02 ENCOUNTER — Telehealth: Payer: Self-pay | Admitting: Urology

## 2023-07-02 NOTE — Telephone Encounter (Signed)
Pt called office and asked if it's necessary for him to keep appt on 8/22 @ 8:30 w/Shannon for Trimix Teaching.  He said we did labs and he didn't know what we did OR the results.  He is also amazed at the cost of his visit no longer than Dr Apolinar Junes was in the room.

## 2023-07-02 NOTE — Telephone Encounter (Signed)
Patient advised. Patient did say that he googled how to do Trimix injections and injected this medication about 2 weeks ago and states "nothing happened." He thinks he injected about 15 ml? Stated whichever is the standard dose. Patient stated he did not realize that coming in for teaching was mandatory. Advised patient to keep his appointment and bring everything with him for his appointment.

## 2023-07-03 NOTE — Progress Notes (Signed)
   07/07/2023 7:37 PM  Craig Banks Jul 28, 1966 [id number]   Referring provider: Joni Reining, PA-C [address] [place],  [place] [address]  Urological history: 1.  Erectile dysfunction -Contributing factors of age, BPH, anxiety, depression and diabetes -Serum testosterone on May 13, 2023 was 1000 -Failed PDE 5 inhibitors  Chief Complaint  Patient presents with   Follow-up   Erectile Dysfunction   HPI: Craig Banks is a 57 y.o. male who presents today for Trimix titration.    The procedure is discussed with patient.  He is allowed to ask questions.  Questions were answered to his satisfaction.    He completed the titration at home.  He is diabetic and he used his diabetic needles to inject his Trimix.  He stated he injected 0.1 of the Trimix and did not achieve an erection.  He then injected 0.2 of the Trimix two days later and achieved a good erection.    Physical Exam:  BP 107/74   Pulse 98   Ht 5\' 7"  (1.702 m)   Wt 174 lb (78.9 kg)   BMI 27.25 kg/m   Constitutional:  Well nourished. Alert and oriented, No acute distress. GU: No CVA tenderness.  No bladder fullness or masses.  Patient with circumcised phallus.  Urethral meatus is patent.  No penile discharge. No penile lesions or rashes. Scrotum without lesions, cysts, rashes and/or edema.   Psychiatric: Normal mood and affect.  Assessment & Plan:    1. Erectile dysfunction -he had one of the syringes with him and demonstrated where he injected the Timix on his penis, he did inject in the correct location -he indicated to me the amount of the medication he injected each time on the syringe and it was 0.1 mcg and 0.2 mcg respectively -I have faxed a prescription in for the Trimix (08/12/29), 0.2 mcg 15 minutes prior to intercourse Advised patient of the condition of priapism, painful erection lasting for more than four hours, and to contact the office or seek treatment in the ED immediately    Return  if symptoms worsen or fail to improve.  Cloretta Ned   [facility] [address] [place], [place] [address] [phone]  I spent 15 minutes on the day of the encounter to include pre-visit record review, face-to-face time with the patient, and post-visit ordering of tests.

## 2023-07-04 ENCOUNTER — Ambulatory Visit: Payer: 59 | Admitting: Urology

## 2023-07-04 VITALS — BP 107/74 | HR 98 | Ht 67.0 in | Wt 174.0 lb

## 2023-07-04 DIAGNOSIS — N528 Other male erectile dysfunction: Secondary | ICD-10-CM | POA: Diagnosis not present

## 2023-07-04 MED ORDER — NONFORMULARY OR COMPOUNDED ITEM: 6 refills | Status: DC

## 2023-07-04 NOTE — Patient Instructions (Signed)
TRIMIX SELF-INJECTION INSTRUCTIONS    DETAILED PROCEDURE  1. GETTING SET UP  A. Proper hygiene is important. Wash your hands and keep the penis clean.  B. Assemble the following:  - Bottle of Trimix  - Alcohol pad  - Syringe  C. Keep the Trimix cold by returning the bottle to the refrigerator, or by placing the bottle in a cup of ice.   2. PREPARE THE SYRINGE  A. Wipe the rubber top of the vial with an alcohol pad.  B. After removing the cap of the needle, pull the plunger back to the desired dosage, filling this volume with air. Use a new needle and syringe each time.  C. Insert the needle through the rubber top and inject the air into the vial.  D. Turn the vial with needle and syringe inserted upside down. Pull back on the syringe plunger in a slow and steady motion until the desired dosage is achieved.  E. Tap the side of the syringe (1cc tuberculin syringe with a 29 gauge needle) to allow any air bubbles to float towards the needle. Avoid having these air bubbles in the syringe when self-injecting by first injecting out the collected bubbles that may form.  F. Remove the needle from the bottle and replace the protective cap on the needle.    3. SELECT AND PREPARE THE SITE FOR INJECTION  A. The proper location for injection is at the 9-11 and 1-3 o'clock positions, between the base and mid-portion of the penis.(see diagram) Avoid the midline because of potential for injury to the urethra (6 o'clock; for urinary passage) and the penile arteries and nerves (near 12 o'clock). Avoid any visible veins or arteries on the surface.  B. Grasp and pull the head of the penis toward the side of your leg with the index finger and thumb (use the left hand, if right handed). While maintaining light tension, select a site for injection.  C. Clean the site with an alcohol pad.   4: INJECT TRIMIX AND APPLY COMPRESSION  A. With a steady and continuous motion, penetrate the skin with the needle at a 90 o  angle. The needle should then be advanced to the hub. Slight resistance is encountered as the needle passes into the proper position within the erectile tissue (corporeal body).  B. Inject the Trimix over approximately 4 seconds. Withdraw the needle from the penis and apply compression to the injection site for approximately 1 minute. Several minutes of compression may be required to avoid bleeding, especially if you are an aspirin user.  C. Replace the cap on the needle and dispose of properly.   If you experience a painful erection that will not go down, take four (30 mg) tablets of pseudoephedrine (Sudafed-not the extended release) and if the erection does not go down in the next hour or increases in pain, contact the office immediately or seek treatment in the ED    

## 2023-07-07 ENCOUNTER — Encounter: Payer: Self-pay | Admitting: Urology

## 2023-07-24 ENCOUNTER — Other Ambulatory Visit: Payer: Self-pay

## 2023-07-24 DIAGNOSIS — E109 Type 1 diabetes mellitus without complications: Secondary | ICD-10-CM

## 2023-07-24 MED ORDER — FREESTYLE LIBRE 3 SENSOR MISC: 11 refills | Status: DC

## 2023-07-24 MED ORDER — LANTUS SOLOSTAR 100 UNIT/ML ~~LOC~~ SOPN
15.0000 [IU] | PEN_INJECTOR | Freq: Two times a day (BID) | SUBCUTANEOUS | 6 refills | Status: DC
Start: 2023-07-24 — End: 2024-03-05

## 2023-09-24 ENCOUNTER — Other Ambulatory Visit: Payer: Self-pay

## 2023-09-24 ENCOUNTER — Other Ambulatory Visit: Payer: Self-pay | Admitting: Physician Assistant

## 2023-09-24 DIAGNOSIS — E109 Type 1 diabetes mellitus without complications: Secondary | ICD-10-CM

## 2023-09-24 MED ORDER — FREESTYLE LIBRE 3 PLUS SENSOR MISC: 11 refills | Status: DC

## 2023-09-24 NOTE — Telephone Encounter (Signed)
Called [facility] at [phone] & spoke with Wynona Canes.  Advised that we sent 3 Rx's - one new Rx for Freestyle Libre 3 plus sensors because pharmacy informed us Freestyle Libre 3 sensors are on long term back order & requested a new Rx.  Also, sent renewals for Novolog & syringes.  The renewals were sent electronically by the pharmacy & were Epic when I opened Refill Medications.  Christine checked their system.  States the new Rx came in on one system & the two renewals were under something different.  They do have all three Rx's.  Will get them filled & notify Thayer Ohm when all three prescriptions are ready for pick-up.  AMD

## 2023-09-26 ENCOUNTER — Ambulatory Visit: Payer: Self-pay

## 2023-09-26 DIAGNOSIS — H6123 Impacted cerumen, bilateral: Secondary | ICD-10-CM

## 2023-09-26 NOTE — Addendum Note (Signed)
Addended by: Christianne Dolin F on: 09/26/2023 12:59 PM   Modules accepted: Orders

## 2023-09-26 NOTE — Progress Notes (Signed)
Pt presents today with Bilateral cerumen impaction. I able to safley remove wax from both ears. Pt tolerated well and voiced no concerns. Right ear was uncomfortable but pt is satisfied.

## 2023-09-30 ENCOUNTER — Other Ambulatory Visit: Payer: Self-pay | Admitting: Physician Assistant

## 2023-09-30 ENCOUNTER — Ambulatory Visit: Payer: Self-pay | Admitting: Physician Assistant

## 2023-09-30 ENCOUNTER — Encounter: Payer: Self-pay | Admitting: Physician Assistant

## 2023-09-30 DIAGNOSIS — S00432A Contusion of left ear, initial encounter: Secondary | ICD-10-CM

## 2023-09-30 NOTE — Progress Notes (Signed)
   Subjective: Hematoma left ear    Patient ID: Craig Banks, male    DOB: December 14, 1965, 57 y.o.   MRN: [id number]  HPI Patient presents with hematoma to left ear.  Patient did not give a confirmed date of contusion to the left ear.  States approximately 2 days ago while hunting.  Denies hearing loss and vertigo.   Review of Systems Type 1 diabetes    Objective:   Physical Exam Patient presents with a hematoma to the left superior ear.       Assessment & Plan: Hematoma left ear  Obtain patient consent for aspiration.  Area was surgically clean and 1 cc of lidocaine without epi was  inferior aspect of hematoma.  Using a 23-gauge needle 4 mL of serous sanguinis hemolytic was aspirated.  Patient had a compressive applied for 3 to 5 minutes.  Area was then reclean and bandaged.  Patient advised to follow-up if the edema returns.

## 2023-09-30 NOTE — Progress Notes (Signed)
Pt presents today with swollen (cauliflower top of left ear). Does not  remember hitting it.

## 2023-10-01 ENCOUNTER — Encounter: Payer: Self-pay | Admitting: Physician Assistant

## 2023-10-01 ENCOUNTER — Ambulatory Visit: Payer: Self-pay | Admitting: Physician Assistant

## 2023-10-01 DIAGNOSIS — S00432D Contusion of left ear, subsequent encounter: Secondary | ICD-10-CM

## 2023-10-01 NOTE — Progress Notes (Signed)
Pt presents today for re-eval/follow up on left ear.

## 2023-10-01 NOTE — Progress Notes (Signed)
   Subjective: Hematoma left ear    Patient ID: Craig Banks, male    DOB: 11/09/1966, 58 y.o.   MRN: [id number]  HPI Patient follow-up status post 24 hours hematoma to left ear which was aspirated.  Patient states he noticed onset of swelling upon a.m. awakening.  Denies pain or hearing loss.   Review of Systems Type 1 diabetes    Objective:   Physical Exam  Vital signs were deferred.  Hematoma superior aspect of the left ear.      Assessment & Plan: Hematoma left ear   Discussed patient rationale for repeat aspiration.  Patient agreeable.  Area was cleaned and 1 cc of lidocaine 5 epinephrine was filled.  2 mL of fluid was aspirated.  Patient was placed in a pressure bandage and given discharge care instruction.  Follow-up as needed.

## 2023-11-18 ENCOUNTER — Ambulatory Visit: Payer: Self-pay | Admitting: Physician Assistant

## 2023-11-18 ENCOUNTER — Encounter: Payer: Self-pay | Admitting: Physician Assistant

## 2023-11-18 DIAGNOSIS — S46211A Strain of muscle, fascia and tendon of other parts of biceps, right arm, initial encounter: Secondary | ICD-10-CM

## 2023-11-18 DIAGNOSIS — M25511 Pain in right shoulder: Secondary | ICD-10-CM | POA: Insufficient documentation

## 2023-11-18 DIAGNOSIS — M79621 Pain in right upper arm: Secondary | ICD-10-CM | POA: Insufficient documentation

## 2023-11-18 MED ORDER — TRAMADOL-ACETAMINOPHEN 37.5-325 MG PO TABS
1.0000 | ORAL_TABLET | Freq: Four times a day (QID) | ORAL | 0 refills | Status: DC | PRN
Start: 2023-11-18 — End: 2024-03-06

## 2023-11-18 NOTE — Progress Notes (Signed)
   Subjective: Right upper arm pain and swelling    Patient ID: Craig Banks, male    DOB: [birth year], 58 y.o.   MRN: [id number]  HPI Patient presents with a bulge in the bicep area of the right arm.  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  States pain increased with extension.  Denies loss of sensation.   Review of Systems Diabetes    Objective:   Physical Exam No acute distress.  Examination of the right upper arm reveals moderate bulging of the bicep.  Patient has decreased range of motion with flexion and extension.  Neurovascular intact.       Assessment & Plan: Right upper arm pain  Differentials consist of partial tearing of right upper bicep versus strain.  Further evaluation by orthopedics is warranted.  Patient placed in a sling and sent to [facility].  Prescription for Ultracet  was taken to the pharmacy.  Advised patient to follow-up after orthopedic evaluation.

## 2023-12-19 DIAGNOSIS — M66829 Spontaneous rupture of other tendons, unspecified upper arm: Secondary | ICD-10-CM | POA: Diagnosis not present

## 2023-12-19 DIAGNOSIS — M75121 Complete rotator cuff tear or rupture of right shoulder, not specified as traumatic: Secondary | ICD-10-CM | POA: Diagnosis not present

## 2023-12-23 ENCOUNTER — Ambulatory Visit: Payer: Self-pay | Admitting: Physician Assistant

## 2023-12-23 DIAGNOSIS — I499 Cardiac arrhythmia, unspecified: Secondary | ICD-10-CM

## 2023-12-23 NOTE — Progress Notes (Signed)
 Pt presents today for EKG, Pt states he was told by surgeon Dr. Lorenz Romano to get EKG due to irregular pulse and heart rate. Per ron smith,PA-C  after reviewing EKG, Pt is advised to go to cardiology for clearance before surgery.

## 2023-12-24 DIAGNOSIS — E109 Type 1 diabetes mellitus without complications: Secondary | ICD-10-CM | POA: Diagnosis not present

## 2023-12-24 DIAGNOSIS — I4891 Unspecified atrial fibrillation: Secondary | ICD-10-CM | POA: Diagnosis not present

## 2023-12-25 ENCOUNTER — Ambulatory Visit: Payer: Self-pay | Admitting: Physician Assistant

## 2023-12-30 DIAGNOSIS — S46111A Strain of muscle, fascia and tendon of long head of biceps, right arm, initial encounter: Secondary | ICD-10-CM | POA: Diagnosis not present

## 2023-12-30 DIAGNOSIS — S46011A Strain of muscle(s) and tendon(s) of the rotator cuff of right shoulder, initial encounter: Secondary | ICD-10-CM | POA: Diagnosis not present

## 2023-12-30 DIAGNOSIS — M65911 Unspecified synovitis and tenosynovitis, right shoulder: Secondary | ICD-10-CM | POA: Diagnosis not present

## 2023-12-30 DIAGNOSIS — M75121 Complete rotator cuff tear or rupture of right shoulder, not specified as traumatic: Secondary | ICD-10-CM | POA: Diagnosis not present

## 2023-12-30 DIAGNOSIS — G8918 Other acute postprocedural pain: Secondary | ICD-10-CM | POA: Diagnosis not present

## 2023-12-30 DIAGNOSIS — Z794 Long term (current) use of insulin: Secondary | ICD-10-CM | POA: Diagnosis not present

## 2023-12-30 DIAGNOSIS — M7551 Bursitis of right shoulder: Secondary | ICD-10-CM | POA: Diagnosis not present

## 2023-12-30 DIAGNOSIS — S43491A Other sprain of right shoulder joint, initial encounter: Secondary | ICD-10-CM | POA: Diagnosis not present

## 2023-12-30 DIAGNOSIS — M19011 Primary osteoarthritis, right shoulder: Secondary | ICD-10-CM | POA: Diagnosis not present

## 2024-01-13 DIAGNOSIS — M25611 Stiffness of right shoulder, not elsewhere classified: Secondary | ICD-10-CM | POA: Diagnosis not present

## 2024-01-13 DIAGNOSIS — M25511 Pain in right shoulder: Secondary | ICD-10-CM | POA: Diagnosis not present

## 2024-01-28 DIAGNOSIS — M25611 Stiffness of right shoulder, not elsewhere classified: Secondary | ICD-10-CM | POA: Diagnosis not present

## 2024-01-28 DIAGNOSIS — M25511 Pain in right shoulder: Secondary | ICD-10-CM | POA: Diagnosis not present

## 2024-01-30 DIAGNOSIS — M25611 Stiffness of right shoulder, not elsewhere classified: Secondary | ICD-10-CM | POA: Diagnosis not present

## 2024-01-30 DIAGNOSIS — M25511 Pain in right shoulder: Secondary | ICD-10-CM | POA: Diagnosis not present

## 2024-02-05 DIAGNOSIS — M25611 Stiffness of right shoulder, not elsewhere classified: Secondary | ICD-10-CM | POA: Diagnosis not present

## 2024-02-05 DIAGNOSIS — M25511 Pain in right shoulder: Secondary | ICD-10-CM | POA: Diagnosis not present

## 2024-02-10 ENCOUNTER — Other Ambulatory Visit: Payer: Self-pay | Admitting: Physician Assistant

## 2024-02-10 MED ORDER — NIRMATRELVIR/RITONAVIR (PAXLOVID)TABLET: 3.0000 | ORAL_TABLET | Freq: Two times a day (BID) | ORAL | 0 refills | Status: AC

## 2024-02-10 NOTE — Progress Notes (Signed)
paxlovi

## 2024-02-12 DIAGNOSIS — M25611 Stiffness of right shoulder, not elsewhere classified: Secondary | ICD-10-CM | POA: Diagnosis not present

## 2024-02-12 DIAGNOSIS — M25511 Pain in right shoulder: Secondary | ICD-10-CM | POA: Diagnosis not present

## 2024-02-14 DIAGNOSIS — M25611 Stiffness of right shoulder, not elsewhere classified: Secondary | ICD-10-CM | POA: Diagnosis not present

## 2024-02-14 DIAGNOSIS — M25511 Pain in right shoulder: Secondary | ICD-10-CM | POA: Diagnosis not present

## 2024-02-19 DIAGNOSIS — M25611 Stiffness of right shoulder, not elsewhere classified: Secondary | ICD-10-CM | POA: Diagnosis not present

## 2024-02-19 DIAGNOSIS — M25511 Pain in right shoulder: Secondary | ICD-10-CM | POA: Diagnosis not present

## 2024-02-21 DIAGNOSIS — M25511 Pain in right shoulder: Secondary | ICD-10-CM | POA: Diagnosis not present

## 2024-02-21 DIAGNOSIS — M25611 Stiffness of right shoulder, not elsewhere classified: Secondary | ICD-10-CM | POA: Diagnosis not present

## 2024-02-26 ENCOUNTER — Emergency Department

## 2024-02-26 ENCOUNTER — Other Ambulatory Visit: Payer: Self-pay

## 2024-02-26 ENCOUNTER — Inpatient Hospital Stay

## 2024-02-26 ENCOUNTER — Inpatient Hospital Stay
Admission: EM | Admit: 2024-02-26 | Discharge: 2024-03-06 | DRG: 481 | Disposition: A | Discharge status: Skilled Nursing Facility | Attending: Hospitalist | Admitting: Hospitalist

## 2024-02-26 DIAGNOSIS — Y9302 Activity, running: Secondary | ICD-10-CM | POA: Diagnosis present

## 2024-02-26 DIAGNOSIS — F419 Anxiety disorder, unspecified: Secondary | ICD-10-CM | POA: Diagnosis present

## 2024-02-26 DIAGNOSIS — S7291XA Unspecified fracture of right femur, initial encounter for closed fracture: Secondary | ICD-10-CM | POA: Diagnosis present

## 2024-02-26 DIAGNOSIS — S7291XD Unspecified fracture of right femur, subsequent encounter for closed fracture with routine healing: Secondary | ICD-10-CM | POA: Diagnosis not present

## 2024-02-26 DIAGNOSIS — S79001A Unspecified physeal fracture of upper end of right femur, initial encounter for closed fracture: Secondary | ICD-10-CM | POA: Diagnosis not present

## 2024-02-26 DIAGNOSIS — Y9289 Other specified places as the place of occurrence of the external cause: Secondary | ICD-10-CM | POA: Diagnosis not present

## 2024-02-26 DIAGNOSIS — F32A Depression, unspecified: Secondary | ICD-10-CM | POA: Diagnosis present

## 2024-02-26 DIAGNOSIS — M25471 Effusion, right ankle: Secondary | ICD-10-CM | POA: Diagnosis present

## 2024-02-26 DIAGNOSIS — D62 Acute posthemorrhagic anemia: Secondary | ICD-10-CM | POA: Diagnosis not present

## 2024-02-26 DIAGNOSIS — S72141D Displaced intertrochanteric fracture of right femur, subsequent encounter for closed fracture with routine healing: Secondary | ICD-10-CM | POA: Diagnosis not present

## 2024-02-26 DIAGNOSIS — Z9049 Acquired absence of other specified parts of digestive tract: Secondary | ICD-10-CM | POA: Diagnosis not present

## 2024-02-26 DIAGNOSIS — E109 Type 1 diabetes mellitus without complications: Secondary | ICD-10-CM

## 2024-02-26 DIAGNOSIS — R739 Hyperglycemia, unspecified: Secondary | ICD-10-CM | POA: Diagnosis not present

## 2024-02-26 DIAGNOSIS — S72141A Displaced intertrochanteric fracture of right femur, initial encounter for closed fracture: Secondary | ICD-10-CM | POA: Diagnosis not present

## 2024-02-26 DIAGNOSIS — Z751 Person awaiting admission to adequate facility elsewhere: Secondary | ICD-10-CM

## 2024-02-26 DIAGNOSIS — Y990 Civilian activity done for income or pay: Secondary | ICD-10-CM

## 2024-02-26 DIAGNOSIS — E871 Hypo-osmolality and hyponatremia: Secondary | ICD-10-CM | POA: Diagnosis present

## 2024-02-26 DIAGNOSIS — W010XXA Fall on same level from slipping, tripping and stumbling without subsequent striking against object, initial encounter: Secondary | ICD-10-CM | POA: Diagnosis present

## 2024-02-26 DIAGNOSIS — R601 Generalized edema: Secondary | ICD-10-CM | POA: Diagnosis not present

## 2024-02-26 DIAGNOSIS — I82441 Acute embolism and thrombosis of right tibial vein: Secondary | ICD-10-CM | POA: Diagnosis present

## 2024-02-26 DIAGNOSIS — I2510 Atherosclerotic heart disease of native coronary artery without angina pectoris: Secondary | ICD-10-CM | POA: Diagnosis present

## 2024-02-26 DIAGNOSIS — D649 Anemia, unspecified: Secondary | ICD-10-CM | POA: Diagnosis present

## 2024-02-26 DIAGNOSIS — S72391A Other fracture of shaft of right femur, initial encounter for closed fracture: Secondary | ICD-10-CM

## 2024-02-26 DIAGNOSIS — E1065 Type 1 diabetes mellitus with hyperglycemia: Secondary | ICD-10-CM | POA: Diagnosis present

## 2024-02-26 DIAGNOSIS — E0800 Diabetes mellitus due to underlying condition with hyperosmolarity without nonketotic hyperglycemic-hyperosmolar coma (NKHHC): Secondary | ICD-10-CM

## 2024-02-26 DIAGNOSIS — M1611 Unilateral primary osteoarthritis, right hip: Secondary | ICD-10-CM | POA: Diagnosis not present

## 2024-02-26 DIAGNOSIS — R600 Localized edema: Secondary | ICD-10-CM | POA: Diagnosis not present

## 2024-02-26 DIAGNOSIS — I10 Essential (primary) hypertension: Secondary | ICD-10-CM | POA: Diagnosis present

## 2024-02-26 DIAGNOSIS — S7221XA Displaced subtrochanteric fracture of right femur, initial encounter for closed fracture: Secondary | ICD-10-CM | POA: Diagnosis not present

## 2024-02-26 DIAGNOSIS — Z833 Family history of diabetes mellitus: Secondary | ICD-10-CM | POA: Diagnosis not present

## 2024-02-26 DIAGNOSIS — R609 Edema, unspecified: Secondary | ICD-10-CM | POA: Diagnosis not present

## 2024-02-26 DIAGNOSIS — Z7982 Long term (current) use of aspirin: Secondary | ICD-10-CM | POA: Diagnosis not present

## 2024-02-26 DIAGNOSIS — I82451 Acute embolism and thrombosis of right peroneal vein: Secondary | ICD-10-CM | POA: Diagnosis present

## 2024-02-26 DIAGNOSIS — I493 Ventricular premature depolarization: Secondary | ICD-10-CM | POA: Diagnosis present

## 2024-02-26 DIAGNOSIS — M25559 Pain in unspecified hip: Secondary | ICD-10-CM | POA: Diagnosis not present

## 2024-02-26 DIAGNOSIS — M19071 Primary osteoarthritis, right ankle and foot: Secondary | ICD-10-CM | POA: Diagnosis not present

## 2024-02-26 DIAGNOSIS — R000 Tachycardia, unspecified: Secondary | ICD-10-CM | POA: Diagnosis not present

## 2024-02-26 DIAGNOSIS — W19XXXA Unspecified fall, initial encounter: Secondary | ICD-10-CM | POA: Diagnosis not present

## 2024-02-26 LAB — BASIC METABOLIC PANEL WITH GFR
Anion gap: 7 (ref 5–15)
BUN: 19 mg/dL (ref 6–20)
CO2: 25 mmol/L (ref 22–32)
Calcium: 8.7 mg/dL — ABNORMAL LOW (ref 8.9–10.3)
Chloride: 105 mmol/L (ref 98–111)
Creatinine, Ser: 1.01 mg/dL (ref 0.61–1.24)
GFR, Estimated: >60 mL/min (ref >60)
Glucose, Bld: 253 mg/dL — ABNORMAL HIGH (ref 70–99)
Potassium: 4.9 mmol/L (ref 3.5–5.1)
Sodium: 137 mmol/L (ref 135–145)

## 2024-02-26 LAB — TYPE AND SCREEN
ABO/RH(D): O NEG
Antibody Screen: NEGATIVE

## 2024-02-26 LAB — CBC WITH DIFFERENTIAL/PLATELET
Abs Immature Granulocytes: 0.06 10*3/uL (ref 0.00–0.07)
Basophils Absolute: 0 10*3/uL (ref 0.0–0.1)
Basophils Relative: 0 %
Eosinophils Absolute: 0.3 10*3/uL (ref 0.0–0.5)
Eosinophils Relative: 3 %
HCT: 40.3 % (ref 39.0–52.0)
Hemoglobin: 13.5 g/dL (ref 13.0–17.0)
Immature Granulocytes: 1 %
Lymphocytes Relative: 12 %
Lymphs Abs: 1.1 10*3/uL (ref 0.7–4.0)
MCH: 30.3 pg (ref 26.0–34.0)
MCHC: 33.5 g/dL (ref 30.0–36.0)
MCV: 90.6 fL (ref 80.0–100.0)
Monocytes Absolute: 0.9 10*3/uL (ref 0.1–1.0)
Monocytes Relative: 9 %
Neutro Abs: 7.2 10*3/uL (ref 1.7–7.7)
Neutrophils Relative %: 75 %
Platelets: 269 10*3/uL (ref 150–400)
RBC: 4.45 MIL/uL (ref 4.22–5.81)
RDW: 13.1 % (ref 11.5–15.5)
WBC: 9.6 10*3/uL (ref 4.0–10.5)
nRBC: 0 % (ref 0.0–0.2)

## 2024-02-26 LAB — PROTIME-INR
INR: 1.1 (ref 0.8–1.2)
Prothrombin Time: 14.2 s (ref 11.4–15.2)

## 2024-02-26 LAB — GLUCOSE, CAPILLARY
Glucose-Capillary: 214 mg/dL — ABNORMAL HIGH (ref 70–99)
Glucose-Capillary: 259 mg/dL — ABNORMAL HIGH (ref 70–99)

## 2024-02-26 LAB — APTT: aPTT: 26 s (ref 24–36)

## 2024-02-26 LAB — HEMOGLOBIN A1C
Hgb A1c MFr Bld: 8.2 % — ABNORMAL HIGH (ref 4.8–5.6)
Mean Plasma Glucose: 188.64 mg/dL

## 2024-02-26 MED ORDER — TRAMADOL-ACETAMINOPHEN 37.5-325 MG PO TABS
1.0000 | ORAL_TABLET | Freq: Four times a day (QID) | ORAL | Status: DC | PRN
  Administered 2024-02-26: 1 via ORAL
  Filled 2024-02-26: qty 1

## 2024-02-26 MED ORDER — INSULIN GLARGINE-YFGN 100 UNIT/ML ~~LOC~~ SOLN
10.0000 [IU] | Freq: Every day | SUBCUTANEOUS | Status: DC
  Administered 2024-02-26 – 2024-02-28 (×3): 10 [IU] via SUBCUTANEOUS
  Filled 2024-02-26 (×3): qty 0.1

## 2024-02-26 MED ORDER — ZOLPIDEM TARTRATE 5 MG PO TABS: 10.0000 mg | ORAL_TABLET | Freq: Every evening | ORAL | Status: DC | PRN

## 2024-02-26 MED ORDER — SENNOSIDES-DOCUSATE SODIUM 8.6-50 MG PO TABS: 1.0000 | ORAL_TABLET | Freq: Every evening | ORAL | Status: DC | PRN

## 2024-02-26 MED ORDER — ASPIRIN 81 MG PO TBEC
81.0000 mg | DELAYED_RELEASE_TABLET | Freq: Every day | ORAL | Status: DC
  Administered 2024-02-26 – 2024-03-06 (×9): 81 mg via ORAL
  Filled 2024-02-26 (×9): qty 1

## 2024-02-26 MED ORDER — HYDROMORPHONE HCL 1 MG/ML IJ SOLN
0.5000 mg | INTRAMUSCULAR | Status: DC | PRN
  Administered 2024-02-26 – 2024-02-27 (×5): 0.5 mg via INTRAVENOUS
  Filled 2024-02-26 (×5): qty 0.5

## 2024-02-26 MED ORDER — BISACODYL 5 MG PO TBEC: 5.0000 mg | DELAYED_RELEASE_TABLET | Freq: Every day | ORAL | Status: DC | PRN

## 2024-02-26 MED ORDER — ENOXAPARIN SODIUM 40 MG/0.4ML IJ SOSY
40.0000 mg | PREFILLED_SYRINGE | INTRAMUSCULAR | Status: DC
  Administered 2024-02-27: 40 mg via SUBCUTANEOUS
  Filled 2024-02-26: qty 0.4

## 2024-02-26 MED ORDER — INSULIN ASPART 100 UNIT/ML IJ SOLN
0.0000 [IU] | Freq: Every day | INTRAMUSCULAR | Status: DC
  Administered 2024-02-26: 3 [IU] via SUBCUTANEOUS
  Administered 2024-02-27: 2 [IU] via SUBCUTANEOUS
  Administered 2024-02-28: 4 [IU] via SUBCUTANEOUS
  Administered 2024-02-29 – 2024-03-01 (×2): 2 [IU] via SUBCUTANEOUS
  Administered 2024-03-04: 3 [IU] via SUBCUTANEOUS
  Filled 2024-02-26 (×6): qty 1

## 2024-02-26 MED ORDER — INSULIN ASPART 100 UNIT/ML IJ SOLN
0.0000 [IU] | Freq: Three times a day (TID) | INTRAMUSCULAR | Status: DC
  Administered 2024-02-27: 4 [IU] via SUBCUTANEOUS
  Administered 2024-02-28: 7 [IU] via SUBCUTANEOUS
  Administered 2024-02-28: 5 [IU] via SUBCUTANEOUS
  Filled 2024-02-26 (×3): qty 1

## 2024-02-26 MED ORDER — TRAZODONE HCL 100 MG PO TABS
100.0000 mg | ORAL_TABLET | Freq: Every day | ORAL | Status: DC
  Administered 2024-02-26 – 2024-03-04 (×8): 100 mg via ORAL
  Filled 2024-02-26 (×9): qty 1

## 2024-02-26 MED ORDER — HYDROMORPHONE HCL 1 MG/ML IJ SOLN
1.0000 mg | Freq: Once | INTRAMUSCULAR | Status: AC
  Administered 2024-02-26: 1 mg via INTRAVENOUS
  Filled 2024-02-26: qty 1

## 2024-02-26 MED ORDER — HYDROMORPHONE HCL 1 MG/ML IJ SOLN: 0.5000 mg | Freq: Once | INTRAMUSCULAR | Status: DC

## 2024-02-26 MED ORDER — INSULIN ASPART 100 UNIT/ML IJ SOLN
5.0000 [IU] | Freq: Once | INTRAMUSCULAR | Status: AC
  Administered 2024-02-26: 5 [IU] via SUBCUTANEOUS

## 2024-02-26 NOTE — H&P (Signed)
 . History and Physical    KELLEY KNOTH ZOX:[id number] DOB: Dec 25, 1965 DOA: 02/26/2024  PCP: Joni Reining, PA-C (Confirm with patient/family/NH records and if not entered, this has to be entered at [facility] point of entry) Patient coming from: Home  I have personally briefly reviewed patient's old medical records in [facility]  Chief Complaint: I fell and broke my leg  HPI: Craig Banks is a 58 y.o. male with medical history significant of IDDM, presented with mechanical fall and right femur fracture.  Patient was walking his dog, when his dog suddenly turned around and started to run and patient was caught in surprise, had to turn around but lost balance and fell to his side and hit his right leg on a concrete floor.  Immediately started to feel excruciating pain and unable to get up himself.  Denied any LOC or head injury.  ED Course: Afebrile, borderline tachycardia not hypoxic no hypotension.  X-ray showed proximal femur fracture.  Blood work showed glucose 253 BUN 19 creatinine 1.0.  WBC 9.6 hemoglobin 13.5.  Patient was given IV Dilaudid x 2 in the ED.  Review of Systems: As per HPI otherwise 14 point review of systems negative.    Past Medical History:  Diagnosis Date   Diabetes (HCC)    Diabetes mellitus without complication [facility])    age 59   Erectile dysfunction    Proteinuria 2006    Past Surgical History:  Procedure Laterality Date   CHOLECYSTECTOMY  02/18/15   SHOULDER SURGERY Left 2010   WRIST SURGERY Right 1995     reports that he has never smoked. He has never used smokeless tobacco. He reports current alcohol use. He reports that he does not use drugs.  No Known Allergies  Family History  Problem Relation Age of Onset   Cancer Mother    Diabetes Sister     Prior to Admission medications   Medication Sig Start Date End Date Taking? Authorizing Provider  aspirin EC 81 MG tablet Take 81 mg by mouth daily.    [provider]  Blood  Glucose Monitoring Suppl (FREESTYLE FREEDOM LITE) w/Device KIT Use to test four times daily 08/01/21   Joni Reining, PA-C  Continuous Glucose Sensor (FREESTYLE LIBRE 3 PLUS SENSOR) MISC Change sensor every 15 days. 09/24/23   Joni Reining, PA-C  Continuous Glucose Sensor (FREESTYLE LIBRE 3 SENSOR) MISC USE AS DIRECTED EVERY 14 DAYS 07/24/23   Joni Reining, PA-C  glucose blood test strip Use as instructed 07/23/19   Welford Roche D, MD  glucose blood test strip Use as instructed 03/08/20   Joni Reining, PA-C  insulin glargine (LANTUS SOLOSTAR) 100 UNIT/ML Solostar Pen Inject 15 Units into the skin 2 (two) times daily. 07/24/23   Joni Reining, PA-C  insulin lispro (HUMALOG) 100 UNIT/ML injection Inject 3 Units into the skin 3 (three) times daily before meals. SSI    [provider]  Insulin Syringe-Needle U-100 (BD INSULIN SYRINGE U/F) 31G X 5/16" 0.3 ML MISC USE AS DIRECTED UP TO SIX INJECTIONS DAILY 09/24/23   Joni Reining, PA-C  NONFORMULARY OR COMPOUNDED ITEM Trimix (30/1/10)-(Pap/Phent/PGE)  Test Dose  1ml vial   Qty #3 Refills 0  [facility] [phone] Fax [phone] 06/05/23   Vanna Scotland, MD  NONFORMULARY OR COMPOUNDED ITEM Trimix (30/1/10)-(Pap/Phent/PGE)  Dosage: Inject 0.2 cc per injection   Vial 1ml  Qty #10 Refills 6  [facility] [phone] Fax [phone]  07/04/23   Matilde Son A, PA-C  NOVOLOG 100 UNIT/ML injection AS DIRECTED FOR SLIDING SCALE 09/24/23   Marcina Severe, PA-C  traMADol-acetaminophen (ULTRACET) 37.5-325 MG tablet Take 1 tablet by mouth every 6 (six) hours as needed. 11/18/23   Marcina Severe, PA-C  traZODone (DESYREL) 100 MG tablet Take 1 tablet (100 mg total) by mouth at bedtime. 04/23/23   Scarboro, Adam J, NP  VIAGRA 100 MG tablet Take 1 tablet (100 mg total) by mouth See admin instructions. 04/23/23   Scarboro, Adam J, NP  zolpidem (AMBIEN) 10 MG tablet Take 1 tablet (10 mg total) by mouth at  bedtime as needed for sleep. 05/20/23 06/19/23  Marcina Severe, PA-C    Physical Exam: Vitals:   02/26/24 1450 02/26/24 1600  BP: 120/78 (!) 125/100  Pulse: (!) 143 (!) 58  Resp: 18 12  Temp: 98 F (36.7 C)   TempSrc: Oral   SpO2: 98% 97%    Constitutional: NAD, calm, comfortable Vitals:   02/26/24 1450 02/26/24 1600  BP: 120/78 (!) 125/100  Pulse: (!) 143 (!) 58  Resp: 18 12  Temp: 98 F (36.7 C)   TempSrc: Oral   SpO2: 98% 97%   Eyes: PERRL, lids and conjunctivae normal ENMT: Mucous membranes are moist. Posterior pharynx clear of any exudate or lesions.Normal dentition.  Neck: normal, supple, no masses, no thyromegaly Respiratory: clear to auscultation bilaterally, no wheezing, no crackles. Normal respiratory effort. No accessory muscle use.  Cardiovascular: Regular rate and rhythm, no murmurs / rubs / gallops. No extremity edema. 2+ pedal pulses. No carotid bruits.  Abdomen: no tenderness, no masses palpated. No hepatosplenomegaly. Bowel sounds positive.  Musculoskeletal: Right leg shortened and rotated. Skin: no rashes, lesions, ulcers. No induration Neurologic: CN 2-12 grossly intact. Sensation intact, DTR normal. Strength 5/5 in all 4.  Psychiatric: Normal judgment and insight. Alert and oriented x 3. Normal mood.     Labs on Admission: I have personally reviewed following labs and imaging studies  CBC: Recent Labs  Lab 02/26/24 1636  WBC 9.6  NEUTROABS 7.2  HGB 13.5  HCT 40.3  MCV 90.6  PLT 269   Basic Metabolic Panel: Recent Labs  Lab 02/26/24 1636  NA 137  K 4.9  CL 105  CO2 25  GLUCOSE 253*  BUN 19  CREATININE 1.01  CALCIUM 8.7*   GFR: CrCl cannot be calculated (Unknown ideal weight.). Liver Function Tests: No results for input(s): "AST", "ALT", "ALKPHOS", "BILITOT", "PROT", "ALBUMIN" in the last 168 hours. No results for input(s): "LIPASE", "AMYLASE" in the last 168 hours. No results for input(s): "AMMONIA" in the last 168  hours. Coagulation Profile: Recent Labs  Lab 02/26/24 1636  INR 1.1   Cardiac Enzymes: No results for input(s): "CKTOTAL", "CKMB", "CKMBINDEX", "TROPONINI" in the last 168 hours. BNP (last 3 results) No results for input(s): "PROBNP" in the last 8760 hours. HbA1C: No results for input(s): "HGBA1C" in the last 72 hours. CBG: No results for input(s): "GLUCAP" in the last 168 hours. Lipid Profile: No results for input(s): "CHOL", "HDL", "LDLCALC", "TRIG", "CHOLHDL", "LDLDIRECT" in the last 72 hours. Thyroid Function Tests: No results for input(s): "TSH", "T4TOTAL", "FREET4", "T3FREE", "THYROIDAB" in the last 72 hours. Anemia Panel: No results for input(s): "VITAMINB12", "FOLATE", "FERRITIN", "TIBC", "IRON", "RETICCTPCT" in the last 72 hours. Urine analysis:    Component Value Date/Time   BILIRUBINUR neg 04/19/2023 0852   PROTEINUR Positive (A) 04/19/2023 0852   UROBILINOGEN 0.2 04/19/2023 0852   NITRITE  neg 04/19/2023 0852   LEUKOCYTESUR Negative 04/19/2023 0852    Radiological Exams on Admission: No results found.  EKG: Independently reviewed.  Sinus tachycardia, no acute ST changes.  Assessment/Plan Principal Problem:   Femur fracture, right (HCC) Active Problems:   Closed displaced intertrochanteric fracture of right femur (HCC)  (please populate well all problems here in Problem List. (For example, if patient is on BP meds at home and you resume or decide to hold them, it is a problem that needs to be her. Same for CAD, COPD, HLD and so on)  Right proximal femur fracture - Secondary to mechanical fall - Pain control -DVT prophylaxis -NPO after midnight for OR tomorrow  IDDM with hyperglycemia - Cut down Lantus from 15 units to 10 units daily - SSI  Anxiety/depression - No acute concern, continue trazodone at bedtime  DVT prophylaxis: Lovenox Code Status: Full code Family Communication: Significant other at bedside Disposition Plan: Patient is sick with  right femur shaft fracture requiring ORIF, expect more than 2 midnight hospital stay Consults called: Ortho Admission status: MedSurg admission   [place] MD [facility] Pager [phone]  02/26/2024, 5:36 PM

## 2024-02-26 NOTE — ED Provider Notes (Signed)
 [facility] Provider Note    Event Date/Time   First MD Initiated Contact with Patient 02/26/24 1457     (approximate)   History   Fall   HPI  Craig Banks is a 58 y.o. male who presents to the ED for evaluation of Fall   Review of cardiology initial outpatient consult clinic visit from 2 months ago.  History of type 1 diabetes, seen for possible A-fib.  Multiple EKGs with sinus tachycardia with frequent PVCs.   Patient presents after mechanical fall at his workplace.  Works at a [employer].  The dog was trying to escape when he was chasing after it but got tripped up and fell backwards and struck his right hip/buttocks on the ground.  No head trauma, syncope or additional injury   Physical Exam   Triage Vital Signs: ED Triage Vitals  Encounter Vitals Group     BP 02/26/24 1450 120/78     Systolic BP Percentile --      Diastolic BP Percentile --      Pulse Rate 02/26/24 1450 (!) 143     Resp 02/26/24 1450 18     Temp 02/26/24 1450 98 F (36.7 C)     Temp Source 02/26/24 1450 Oral     SpO2 02/26/24 1450 98 %     Weight --      Height --      Head Circumference --      Peak Flow --      Pain Score 02/26/24 1448 10     Pain Loc --      Pain Education --      Exclude from Growth Chart --     Most recent vital signs: Vitals:   02/26/24 2040 02/26/24 2204  BP: 104/81 (!) 96/59  Pulse: (!) 118 (!) 51  Resp: 18 19  Temp: 98 F (36.7 C) 98.6 F (37 C)  SpO2: 98% 96%    General: Awake, no distress.  CV:  Good peripheral perfusion.  Resp:  Normal effort.  Abd:  No distention.  MSK:   Neuro:  No focal deficits appreciated. Other:  Laying on his left side, right hip is flexed and he is cradling it with his hands.  Knee, lower leg and foot nontender without deformity.  History is difficult as he refuses to let me touch his right hip or anywhere in his pelvis due to pain.   ED Results / Procedures / Treatments    Labs (all labs ordered are listed, but only abnormal results are displayed) Labs Reviewed  BASIC METABOLIC PANEL WITH GFR - Abnormal; Notable for the following components:      Result Value   Glucose, Bld 253 (*)    Calcium 8.7 (*)    All other components within normal limits  GLUCOSE, CAPILLARY - Abnormal; Notable for the following components:   Glucose-Capillary 214 (*)    All other components within normal limits  GLUCOSE, CAPILLARY - Abnormal; Notable for the following components:   Glucose-Capillary 259 (*)    All other components within normal limits  CBC WITH DIFFERENTIAL/PLATELET  PROTIME-INR  APTT  HIV ANTIBODY (ROUTINE TESTING W REFLEX)  HEMOGLOBIN A1C  TYPE AND SCREEN    EKG Sinus tachycardia at a rate of 132 bpm.  Leftward axis, normal intervals, no STEMI, 1 PVC  RADIOLOGY Plain film of the pelvis and right hip interpreted by me with comminuted displaced intertrochanteric fracture of the right femur  Official radiology report(s): DG HIP PORT UNILAT WITH PELVIS 1V RIGHT Result Date: 02/26/2024 CLINICAL DATA:  Pain after fall. EXAM: DG HIP (WITH OR WITHOUT PELVIS) 1V PORT RIGHT COMPARISON:  None Available. FINDINGS: Single frontal view of the right hip submitted. Comminuted, displaced and angulated subtrochanteric femur fracture. There is also involvement of the lesser trochanter. Femoral head remains seated. IMPRESSION: Comminuted, displaced and angulated subtrochanteric and intertrochanteric femur fracture. Electronically Signed   By: Chadwick Colonel M.D.   On: 02/26/2024 18:56    PROCEDURES and INTERVENTIONS:  Procedures  Medications  HYDROmorphone (DILAUDID) injection 0.5 mg (0.5 mg Intravenous Given 02/26/24 2255)  aspirin EC tablet 81 mg (81 mg Oral Given 02/26/24 2037)  traMADol-acetaminophen (ULTRACET) 37.5-325 MG per tablet 1 tablet (1 tablet Oral Given 02/26/24 2037)  traZODone (DESYREL) tablet 100 mg (100 mg Oral Given 02/26/24 2056)  zolpidem (AMBIEN)  tablet 10 mg (has no administration in time range)  insulin glargine-yfgn (SEMGLEE) injection 10 Units (10 Units Subcutaneous Given 02/26/24 2038)  enoxaparin (LOVENOX) injection 40 mg (has no administration in time range)  senna-docusate (Senokot-S) tablet 1 tablet (has no administration in time range)  bisacodyl (DULCOLAX) EC tablet 5 mg (has no administration in time range)  insulin aspart (novoLOG) injection 0-20 Units (has no administration in time range)  insulin aspart (novoLOG) injection 0-5 Units (3 Units Subcutaneous Given 02/26/24 2055)  HYDROmorphone (DILAUDID) injection 1 mg (1 mg Intravenous Given 02/26/24 1527)  HYDROmorphone (DILAUDID) injection 1 mg (1 mg Intravenous Given 02/26/24 1658)  insulin aspart (novoLOG) injection 5 Units (5 Units Subcutaneous Given 02/26/24 1847)     IMPRESSION / MDM / ASSESSMENT AND PLAN / ED COURSE  I reviewed the triage vital signs and the nursing notes.  Differential diagnosis includes, but is not limited to, hip fracture, pelvic or acetabular fracture, dislocation, syncope, seizure  {Patient presents with symptoms of an acute illness or injury that is potentially life-threatening.  Patient presents after mechanical fall with a hip fracture requiring admission for orthopedic fixation.  No other signs of injuries.  X-ray with intertrochanteric fracture.  I consult with medicine and orthopedics.  Pending preoperative screening blood work at the time of admission.  No preceding illnesses.  Clinical Course as of 02/26/24 2321  Wed Feb 26, 2024  1628 I consult with Dr. Clyda Dark orthopedics, he will see for evaluation [DS]  1650 I consult with hospitalist [DS]    Clinical Course User Index [DS] Arline Bennett, MD     FINAL CLINICAL IMPRESSION(S) / ED DIAGNOSES   Final diagnoses:  Closed displaced intertrochanteric fracture of right femur, initial encounter [facility])     Rx / DC Orders   ED Discharge Orders     None        Note:  This  document was prepared using Dragon voice recognition software and may include unintentional dictation errors.   Arline Bennett, MD 02/26/24 [phone]

## 2024-02-26 NOTE — Consult Note (Signed)
 ORTHOPAEDIC CONSULTATION  REQUESTING PHYSICIAN: Arline Bennett, MD  Chief Complaint:   Right hip fracture  History of Present Illness: Craig Banks is a 58 y.o. male with a history of type 1 diabetes who presented to the emergency room today after a mechanical fall when he was trying to catch a dog that escaped at the animal shelter he fell backwards and landed on concrete on his right hip and buttock.  Denies any head injury or loss of consciousness.  Denies any pre-existing hip pain prior to the fall.  Reports severe pain at this time over the right lateral hip.  Denies any pain in his distal knee ankle and foot.  Denies any shortness of breath or chest pain.  Past Medical History:  Diagnosis Date   Diabetes (HCC)    Diabetes mellitus without complication [facility])    age 10   Erectile dysfunction    Proteinuria 2006   Past Surgical History:  Procedure Laterality Date   CHOLECYSTECTOMY  02/18/15   SHOULDER SURGERY Left 2010   WRIST SURGERY Right 1995   Social History   Socioeconomic History   Marital status: Single    Spouse name: Not on file   Number of children: Not on file   Years of education: Not on file   Highest education level: Not on file  Occupational History   Not on file  Tobacco Use   Smoking status: Never   Smokeless tobacco: Never  Vaping Use   Vaping status: Never Used  Substance and Sexual Activity   Alcohol use: Yes    Alcohol/week: 0.0 standard drinks of alcohol   Drug use: No   Sexual activity: Not on file  Other Topics Concern   Not on file  Social History Narrative   Not on file   Social Drivers of Health   Financial Resource Strain: Not on file  Food Insecurity: Not on file  Transportation Needs: Not on file  Physical Activity: Not on file  Stress: Not on file  Social Connections: Not on file   Family History  Problem Relation Age of Onset   Cancer Mother    Diabetes  Sister    No Known Allergies Prior to Admission medications   Medication Sig Start Date End Date Taking? Authorizing Provider  aspirin EC 81 MG tablet Take 81 mg by mouth daily.    [provider]  Blood Glucose Monitoring Suppl (FREESTYLE FREEDOM LITE) w/Device KIT Use to test four times daily 08/01/21   Marcina Severe, PA-C  Continuous Glucose Sensor (FREESTYLE LIBRE 3 PLUS SENSOR) MISC Change sensor every 15 days. 09/24/23   Marcina Severe, PA-C  Continuous Glucose Sensor (FREESTYLE LIBRE 3 SENSOR) MISC USE AS DIRECTED EVERY 14 DAYS 07/24/23   Marcina Severe, PA-C  glucose blood test strip Use as instructed 07/23/19   Marcelene Sep D, MD  glucose blood test strip Use as instructed 03/08/20   Marcina Severe, PA-C  insulin glargine (LANTUS SOLOSTAR) 100 UNIT/ML Solostar Pen Inject 15 Units into the skin 2 (two) times daily. 07/24/23   Marcina Severe, PA-C  insulin lispro (HUMALOG) 100 UNIT/ML injection Inject 3 Units into the skin 3 (three) times daily before meals. SSI    [provider]  Insulin Syringe-Needle U-100 (BD INSULIN SYRINGE U/F) 31G X 5/16" 0.3 ML MISC USE AS DIRECTED UP TO SIX INJECTIONS DAILY 09/24/23   Marcina Severe, PA-C  NONFORMULARY OR COMPOUNDED ITEM Trimix (30/1/10)-(Pap/Phent/PGE)  Test Dose  1ml  vial   Qty #3 Refills 0  [facility] [phone] Fax [phone] 06/05/23   Vanna Scotland, MD  NONFORMULARY OR COMPOUNDED ITEM Trimix (30/1/10)-(Pap/Phent/PGE)  Dosage: Inject 0.2 cc per injection   Vial 1ml  Qty #10 Refills 6  [facility] [phone] Fax [phone] 07/04/23   Michiel Cowboy A, PA-C  NOVOLOG 100 UNIT/ML injection AS DIRECTED FOR SLIDING SCALE 09/24/23   Joni Reining, PA-C  traMADol-acetaminophen (ULTRACET) 37.5-325 MG tablet Take 1 tablet by mouth every 6 (six) hours as needed. 11/18/23   Joni Reining, PA-C  traZODone (DESYREL) 100 MG tablet Take 1 tablet (100 mg total) by mouth at bedtime.  04/23/23   Scarboro, Coralee North, NP  VIAGRA 100 MG tablet Take 1 tablet (100 mg total) by mouth See admin instructions. 04/23/23   Johnna Acosta, NP  zolpidem (AMBIEN) 10 MG tablet Take 1 tablet (10 mg total) by mouth at bedtime as needed for sleep. 05/20/23 06/19/23  Joni Reining, PA-C   No results found.  Positive ROS: All other systems have been reviewed and were otherwise negative with the exception of those mentioned in the HPI and as above.  Physical Exam: General:  Alert, no acute distress Psychiatric:  Patient is competent for consent with normal mood and affect   Cardiovascular:  No pedal edema Respiratory:  No wheezing, non-labored breathing GI:  Abdomen is soft and non-tender Skin:  No lesions in the area of chief complaint Neurologic:  Sensation intact distally Lymphatic:  No axillary or cervical lymphadenopathy  Orthopedic Exam:  Skin intact over the hip Tender to palpation with swelling over the hip compartments soft No tenderness palpation of the distal femur, knee, tibia, ankle or foot and toes Neurovascular intact able dorsiflex and plantarflex the foot and toes and intact her Salas pedis pulse Pain with any motion of the hip localized to the lateral hip and groin  Secondary survey No tenderness to palpation over other bony prominences in the lower extremities or bilateral upper extremities No pain with logroll or simulated axial loading of the left lower extremity All compartments soft No tenderness to palpation over the cervical or thoracic spine, no bony step-off Motor grossly intact throughout, no focal deficits Sensation grossly intact throughout, no focal deficits Good distal pulses and capillary refill on all extremities   X-rays:  1 view x-ray of the pelvis reviewed which shows a comminuted fracture of the proximal femur likely in the intertrochanteric area with significant shortening.  The partially visualized femoral neck appears intact however limitation in  evaluation due to bony overlap.  No radiologist interpretation available at time of evaluation.  Assessment: Right proximal femur intertrochanteric versus subtrochanteric fracture  Plan: Craig Banks is a 58 year old male who presents with a comminuted right proximal femur fracture.  We discussed treatment options including operative and nonoperative treatments and the patient agrees with the plan under shared decision making model to move forward with preparations for surgical intervention.  The patient will need a intramedullary nail based on the current appearance of the fracture.  Will obtain additional images and a CT scan to further evaluate for fracture pattern and ensure that the pattern is amenable to intramedullary nailing.  A long discussion took place with the patient describing what a intramedullary nail is and what the procedure would entail. The xrays were reviewed with the patient and the implants were discussed. The ability to secure the implant utilizing blade/screw fixation was discussed. Surgical exposures were discussed with the patient.  The hospitalization and post-operative care and rehabilitation were also discussed. The use of perioperative antibiotics and DVT prophylaxis were discussed. The risk, benefits and alternatives to a surgical intervention were discussed at length with the patient. The patient was also advised of risks related to the medical comorbidities. A lengthy discussion took place to review the most common complications including but not limited to: deep vein thrombosis, pulmonary embolus, heart attack, stroke, infection, wound breakdown, dislocation, numbness, leg length in-equality, damage to nerves, intraoperative fracture, hardware cut out, hardware failure, malunion/ nonunion, tendon,muscles, arteries or other blood vessels, death and other possible complications from anesthesia. The patient was told that we will take steps to minimize these risks by using sterile  technique, antibiotics and DVT prophylaxis when appropriate and follow the patient postoperatively in the office setting to monitor progress. The possibility of recurrent pain, no improvement in pain and actual worsening of pain were also discussed with the patient.      The benefits of surgery were discussed with the patient including the potential for improving the patient's current clinical condition through operative intervention. Alternatives to surgical intervention including conservative management were also discussed in detail. All questions were answered to the satisfaction of the patient. The patient participated and agreed to the plan of care as well as the use of the recommended implants for their surgery.    Plan for surgery tomorrow 02/27/2024 N.p.o. after midnight for the operating room Hold anticoagulation after midnight Pain control and CT scan of the right hip Will fill out the consent form tomorrow with the patient  Venus Ginsberg MD     Venus Ginsberg MD  Beeper #:  [phone]  02/26/2024 4:54 PM

## 2024-02-26 NOTE — ED Triage Notes (Signed)
 Pt coming from shelter fell and landed on concrete. Pt reporting severe right hip/thigh pain. Given 175 mcg of fentanyl and 1 gram on tylenol.

## 2024-02-26 NOTE — ED Notes (Signed)
 Xray at bedside attempting to get xray

## 2024-02-27 ENCOUNTER — Inpatient Hospital Stay: Admitting: Anesthesiology

## 2024-02-27 ENCOUNTER — Inpatient Hospital Stay

## 2024-02-27 ENCOUNTER — Encounter
Admission: EM | Disposition: A | Discharge status: Skilled Nursing Facility | Payer: Self-pay | Source: Home / Self Care | Attending: Family Medicine

## 2024-02-27 ENCOUNTER — Other Ambulatory Visit: Payer: Self-pay

## 2024-02-27 ENCOUNTER — Encounter: Payer: Self-pay | Admitting: Internal Medicine

## 2024-02-27 ENCOUNTER — Inpatient Hospital Stay: Attending: Surgery

## 2024-02-27 DIAGNOSIS — S72141A Displaced intertrochanteric fracture of right femur, initial encounter for closed fracture: Principal | ICD-10-CM

## 2024-02-27 HISTORY — PX: INTRAMEDULLARY (IM) NAIL INTERTROCHANTERIC: SHX5875

## 2024-02-27 LAB — COMPREHENSIVE METABOLIC PANEL WITH GFR
ALT: 25 U/L (ref 0–44)
AST: 34 U/L (ref 15–41)
Albumin: 3.1 g/dL — ABNORMAL LOW (ref 3.5–5.0)
Alkaline Phosphatase: 51 U/L (ref 38–126)
Anion gap: 4 — ABNORMAL LOW (ref 5–15)
BUN: 20 mg/dL (ref 6–20)
CO2: 28 mmol/L (ref 22–32)
Calcium: 8.4 mg/dL — ABNORMAL LOW (ref 8.9–10.3)
Chloride: 105 mmol/L (ref 98–111)
Creatinine, Ser: 0.95 mg/dL (ref 0.61–1.24)
GFR, Estimated: >60 mL/min (ref >60)
Glucose, Bld: 145 mg/dL — ABNORMAL HIGH (ref 70–99)
Potassium: 4.5 mmol/L (ref 3.5–5.1)
Sodium: 137 mmol/L (ref 135–145)
Total Bilirubin: 0.9 mg/dL (ref 0.0–1.2)
Total Protein: 5.4 g/dL — ABNORMAL LOW (ref 6.5–8.1)

## 2024-02-27 LAB — CBC WITH DIFFERENTIAL/PLATELET
Abs Immature Granulocytes: 0.04 10*3/uL (ref 0.00–0.07)
Basophils Absolute: 0 10*3/uL (ref 0.0–0.1)
Basophils Relative: 0 %
Eosinophils Absolute: 0.6 10*3/uL — ABNORMAL HIGH (ref 0.0–0.5)
Eosinophils Relative: 6 %
HCT: 36.1 % — ABNORMAL LOW (ref 39.0–52.0)
Hemoglobin: 12.3 g/dL — ABNORMAL LOW (ref 13.0–17.0)
Immature Granulocytes: 0 %
Lymphocytes Relative: 17 %
Lymphs Abs: 1.7 10*3/uL (ref 0.7–4.0)
MCH: 31 pg (ref 26.0–34.0)
MCHC: 34.1 g/dL (ref 30.0–36.0)
MCV: 90.9 fL (ref 80.0–100.0)
Monocytes Absolute: 1.3 10*3/uL — ABNORMAL HIGH (ref 0.1–1.0)
Monocytes Relative: 13 %
Neutro Abs: 6.2 10*3/uL (ref 1.7–7.7)
Neutrophils Relative %: 64 %
Platelets: 217 10*3/uL (ref 150–400)
RBC: 3.97 MIL/uL — ABNORMAL LOW (ref 4.22–5.81)
RDW: 13.2 % (ref 11.5–15.5)
WBC: 9.8 10*3/uL (ref 4.0–10.5)
nRBC: 0 % (ref 0.0–0.2)

## 2024-02-27 LAB — GLUCOSE, CAPILLARY
Glucose-Capillary: 134 mg/dL — ABNORMAL HIGH (ref 70–99)
Glucose-Capillary: 139 mg/dL — ABNORMAL HIGH (ref 70–99)
Glucose-Capillary: 206 mg/dL — ABNORMAL HIGH (ref 70–99)
Glucose-Capillary: 177 mg/dL — ABNORMAL HIGH (ref 70–99)
Glucose-Capillary: 234 mg/dL — ABNORMAL HIGH (ref 70–99)

## 2024-02-27 LAB — SURGICAL PCR SCREEN
MRSA, PCR: NEGATIVE
Staphylococcus aureus: NEGATIVE

## 2024-02-27 LAB — HIV ANTIBODY (ROUTINE TESTING W REFLEX): HIV Screen 4th Generation wRfx: NONREACTIVE

## 2024-02-27 SURGERY — FIXATION, FRACTURE, INTERTROCHANTERIC, WITH INTRAMEDULLARY ROD
Anesthesia: General | Laterality: Right

## 2024-02-27 SURGERY — FIXATION, FRACTURE, INTERTROCHANTERIC, WITH INTRAMEDULLARY ROD
Anesthesia: Choice | Laterality: Right

## 2024-02-27 MED ORDER — MUPIROCIN 2 % EX OINT
1.0000 | TOPICAL_OINTMENT | Freq: Two times a day (BID) | CUTANEOUS | Status: DC
  Filled 2024-02-27 (×2): qty 22

## 2024-02-27 MED ORDER — FENTANYL CITRATE (PF) 100 MCG/2ML IJ SOLN
INTRAMUSCULAR | Status: AC
  Filled 2024-02-27: qty 2

## 2024-02-27 MED ORDER — PHENYLEPHRINE 80 MCG/ML (10ML) SYRINGE FOR IV PUSH (FOR BLOOD PRESSURE SUPPORT)
PREFILLED_SYRINGE | INTRAVENOUS | Status: AC
  Filled 2024-02-27: qty 10

## 2024-02-27 MED ORDER — ACETAMINOPHEN 325 MG PO TABS
325.0000 mg | ORAL_TABLET | Freq: Four times a day (QID) | ORAL | Status: DC | PRN
Start: 2024-02-28 — End: 2024-03-06
  Administered 2024-02-29 – 2024-03-05 (×7): 650 mg via ORAL
  Filled 2024-02-27 (×7): qty 2

## 2024-02-27 MED ORDER — GABAPENTIN 300 MG PO CAPS
ORAL_CAPSULE | ORAL | Status: AC
  Filled 2024-02-27: qty 1

## 2024-02-27 MED ORDER — 0.9 % SODIUM CHLORIDE (POUR BTL) OPTIME
TOPICAL | Status: DC | PRN
Start: 2024-02-27 — End: 2024-02-27
  Administered 2024-02-27: 500 mL

## 2024-02-27 MED ORDER — ROCURONIUM BROMIDE 100 MG/10ML IV SOLN
INTRAVENOUS | Status: DC | PRN
  Administered 2024-02-27: 60 mg via INTRAVENOUS

## 2024-02-27 MED ORDER — FENTANYL CITRATE (PF) 100 MCG/2ML IJ SOLN
25.0000 ug | INTRAMUSCULAR | Status: DC | PRN
  Administered 2024-02-27 (×2): 50 ug via INTRAVENOUS

## 2024-02-27 MED ORDER — PHENYLEPHRINE HCL (PRESSORS) 10 MG/ML IV SOLN
INTRAVENOUS | Status: AC
  Filled 2024-02-27: qty 1

## 2024-02-27 MED ORDER — KETOROLAC TROMETHAMINE 15 MG/ML IJ SOLN
15.0000 mg | Freq: Four times a day (QID) | INTRAMUSCULAR | Status: AC
  Administered 2024-02-27 – 2024-02-28 (×3): 15 mg via INTRAVENOUS
  Filled 2024-02-27 (×3): qty 1

## 2024-02-27 MED ORDER — SODIUM CHLORIDE 0.9 % IV SOLN: INTRAVENOUS | Status: DC

## 2024-02-27 MED ORDER — HYDROMORPHONE HCL 1 MG/ML IJ SOLN
1.0000 mg | INTRAMUSCULAR | Status: DC | PRN
  Administered 2024-02-27: 1 mg via INTRAVENOUS
  Filled 2024-02-27: qty 1

## 2024-02-27 MED ORDER — CEFAZOLIN SODIUM-DEXTROSE 2-4 GM/100ML-% IV SOLN
2.0000 g | Freq: Four times a day (QID) | INTRAVENOUS | Status: AC
  Administered 2024-02-27 (×2): 2 g via INTRAVENOUS
  Filled 2024-02-27 (×2): qty 100

## 2024-02-27 MED ORDER — KETOROLAC TROMETHAMINE 30 MG/ML IJ SOLN
30.0000 mg | Freq: Once | INTRAMUSCULAR | Status: AC
  Administered 2024-02-27: 30 mg via INTRAVENOUS

## 2024-02-27 MED ORDER — OXYCODONE HCL 5 MG/5ML PO SOLN: 5.0000 mg | Freq: Once | ORAL | Status: DC | PRN

## 2024-02-27 MED ORDER — ORAL CARE MOUTH RINSE: 15.0000 mL | Freq: Once | OROMUCOSAL | Status: DC

## 2024-02-27 MED ORDER — PROPOFOL 10 MG/ML IV BOLUS
INTRAVENOUS | Status: AC
  Filled 2024-02-27: qty 20

## 2024-02-27 MED ORDER — FENTANYL CITRATE (PF) 100 MCG/2ML IJ SOLN
INTRAMUSCULAR | Status: DC | PRN
  Administered 2024-02-27: 50 ug via INTRAVENOUS

## 2024-02-27 MED ORDER — FENTANYL CITRATE PF 50 MCG/ML IJ SOSY
50.0000 ug | PREFILLED_SYRINGE | INTRAMUSCULAR | Status: DC | PRN
  Administered 2024-02-27: 50 ug via INTRAVENOUS

## 2024-02-27 MED ORDER — ACETAMINOPHEN 500 MG PO TABS
1000.0000 mg | ORAL_TABLET | Freq: Once | ORAL | Status: AC
  Administered 2024-02-27: 1000 mg via ORAL

## 2024-02-27 MED ORDER — INSULIN ASPART 100 UNIT/ML IJ SOLN
5.0000 [IU] | Freq: Once | INTRAMUSCULAR | Status: AC
  Administered 2024-02-27: 5 [IU] via SUBCUTANEOUS

## 2024-02-27 MED ORDER — METOCLOPRAMIDE HCL 5 MG PO TABS: 5.0000 mg | ORAL_TABLET | Freq: Three times a day (TID) | ORAL | Status: DC | PRN

## 2024-02-27 MED ORDER — BUPIVACAINE-EPINEPHRINE (PF) 0.5% -1:200000 IJ SOLN
INTRAMUSCULAR | Status: AC
  Filled 2024-02-27: qty 30

## 2024-02-27 MED ORDER — PHENYLEPHRINE 80 MCG/ML (10ML) SYRINGE FOR IV PUSH (FOR BLOOD PRESSURE SUPPORT)
PREFILLED_SYRINGE | INTRAVENOUS | Status: DC | PRN
  Administered 2024-02-27: 160 ug via INTRAVENOUS
  Administered 2024-02-27: 80 ug via INTRAVENOUS

## 2024-02-27 MED ORDER — KETOROLAC TROMETHAMINE 30 MG/ML IJ SOLN
INTRAMUSCULAR | Status: AC
  Filled 2024-02-27: qty 1

## 2024-02-27 MED ORDER — MIDAZOLAM HCL 2 MG/2ML IJ SOLN
INTRAMUSCULAR | Status: AC
  Filled 2024-02-27: qty 2

## 2024-02-27 MED ORDER — CEFAZOLIN SODIUM-DEXTROSE 2-4 GM/100ML-% IV SOLN
2.0000 g | INTRAVENOUS | Status: AC
  Administered 2024-02-27: 2 g via INTRAVENOUS

## 2024-02-27 MED ORDER — LIDOCAINE HCL (PF) 2 % IJ SOLN
INTRAMUSCULAR | Status: AC
  Filled 2024-02-27: qty 5

## 2024-02-27 MED ORDER — BUPIVACAINE LIPOSOME 1.3 % IJ SUSP
INTRAMUSCULAR | Status: AC
  Filled 2024-02-27: qty 10

## 2024-02-27 MED ORDER — GABAPENTIN 300 MG PO CAPS
300.0000 mg | ORAL_CAPSULE | Freq: Once | ORAL | Status: AC
  Administered 2024-02-27: 300 mg via ORAL

## 2024-02-27 MED ORDER — VASOPRESSIN 20 UNIT/ML IV SOLN
INTRAVENOUS | Status: DC | PRN
  Administered 2024-02-27 (×2): 1 [IU] via INTRAVENOUS

## 2024-02-27 MED ORDER — PHENYLEPHRINE HCL-NACL 20-0.9 MG/250ML-% IV SOLN
INTRAVENOUS | Status: DC | PRN
  Administered 2024-02-27: 80 ug via INTRAVENOUS
  Administered 2024-02-27: 160 ug via INTRAVENOUS
  Administered 2024-02-27: 80 ug via INTRAVENOUS
  Administered 2024-02-27: 40 ug/min via INTRAVENOUS
  Administered 2024-02-27: 40 ug via INTRAVENOUS
  Administered 2024-02-27: 160 ug via INTRAVENOUS

## 2024-02-27 MED ORDER — ONDANSETRON HCL 4 MG/2ML IJ SOLN
INTRAMUSCULAR | Status: DC | PRN
  Administered 2024-02-27: 4 mg via INTRAVENOUS

## 2024-02-27 MED ORDER — DEXAMETHASONE SODIUM PHOSPHATE 10 MG/ML IJ SOLN
INTRAMUSCULAR | Status: DC | PRN
  Administered 2024-02-27: 5 mg via INTRAVENOUS

## 2024-02-27 MED ORDER — BISACODYL 10 MG RE SUPP: 10.0000 mg | Freq: Every day | RECTAL | Status: DC | PRN

## 2024-02-27 MED ORDER — TRANEXAMIC ACID-NACL 1000-0.7 MG/100ML-% IV SOLN
INTRAVENOUS | Status: AC
  Filled 2024-02-27: qty 100

## 2024-02-27 MED ORDER — ACETAMINOPHEN 500 MG PO TABS
ORAL_TABLET | ORAL | Status: AC
  Filled 2024-02-27: qty 2

## 2024-02-27 MED ORDER — ALBUMIN HUMAN 5 % IV SOLN: INTRAVENOUS | Status: DC | PRN

## 2024-02-27 MED ORDER — FENTANYL CITRATE PF 50 MCG/ML IJ SOSY
PREFILLED_SYRINGE | INTRAMUSCULAR | Status: AC
  Filled 2024-02-27: qty 1

## 2024-02-27 MED ORDER — LIDOCAINE HCL (CARDIAC) PF 100 MG/5ML IV SOSY
PREFILLED_SYRINGE | INTRAVENOUS | Status: DC | PRN
  Administered 2024-02-27: 100 mg via INTRAVENOUS

## 2024-02-27 MED ORDER — CELECOXIB 200 MG PO CAPS
200.0000 mg | ORAL_CAPSULE | Freq: Once | ORAL | Status: AC
  Administered 2024-02-27: 200 mg via ORAL

## 2024-02-27 MED ORDER — ONDANSETRON HCL 4 MG/2ML IJ SOLN
4.0000 mg | Freq: Four times a day (QID) | INTRAMUSCULAR | Status: DC | PRN
  Administered 2024-02-27 – 2024-03-01 (×4): 4 mg via INTRAVENOUS
  Filled 2024-02-27 (×4): qty 2

## 2024-02-27 MED ORDER — SUGAMMADEX SODIUM 200 MG/2ML IV SOLN
INTRAVENOUS | Status: DC | PRN
  Administered 2024-02-27: 160 mg via INTRAVENOUS

## 2024-02-27 MED ORDER — CALCIUM CHLORIDE 10 % IV SOLN
INTRAVENOUS | Status: DC | PRN
  Administered 2024-02-27 (×2): 500 mg via INTRAVENOUS

## 2024-02-27 MED ORDER — CELECOXIB 200 MG PO CAPS
ORAL_CAPSULE | ORAL | Status: AC
  Filled 2024-02-27: qty 1

## 2024-02-27 MED ORDER — FLEET ENEMA RE ENEM: 1.0000 | ENEMA | Freq: Once | RECTAL | Status: DC | PRN

## 2024-02-27 MED ORDER — OXYCODONE HCL 5 MG PO TABS: 5.0000 mg | ORAL_TABLET | Freq: Once | ORAL | Status: DC | PRN

## 2024-02-27 MED ORDER — POLYETHYLENE GLYCOL 3350 17 G PO PACK
17.0000 g | PACK | Freq: Two times a day (BID) | ORAL | Status: DC
  Administered 2024-02-28 – 2024-03-03 (×4): 17 g via ORAL
  Filled 2024-02-27 (×6): qty 1

## 2024-02-27 MED ORDER — ROCURONIUM BROMIDE 10 MG/ML (PF) SYRINGE
PREFILLED_SYRINGE | INTRAVENOUS | Status: AC
  Filled 2024-02-27: qty 10

## 2024-02-27 MED ORDER — ACETAMINOPHEN 500 MG PO TABS
1000.0000 mg | ORAL_TABLET | Freq: Four times a day (QID) | ORAL | Status: AC
  Administered 2024-02-27 – 2024-02-28 (×4): 1000 mg via ORAL
  Filled 2024-02-27 (×5): qty 2

## 2024-02-27 MED ORDER — ONDANSETRON HCL 4 MG/2ML IJ SOLN
INTRAMUSCULAR | Status: AC
  Filled 2024-02-27: qty 2

## 2024-02-27 MED ORDER — TRANEXAMIC ACID-NACL 1000-0.7 MG/100ML-% IV SOLN
INTRAVENOUS | Status: DC | PRN
  Administered 2024-02-27: 1000 mg via INTRAVENOUS

## 2024-02-27 MED ORDER — BUPIVACAINE-EPINEPHRINE (PF) 0.5% -1:200000 IJ SOLN
INTRAMUSCULAR | Status: DC | PRN
  Administered 2024-02-27: 20 mL

## 2024-02-27 MED ORDER — CEFAZOLIN SODIUM-DEXTROSE 2-4 GM/100ML-% IV SOLN
INTRAVENOUS | Status: AC
  Filled 2024-02-27: qty 100

## 2024-02-27 MED ORDER — PROPOFOL 10 MG/ML IV BOLUS
INTRAVENOUS | Status: DC | PRN
  Administered 2024-02-27: 70 mg via INTRAVENOUS

## 2024-02-27 MED ORDER — DROPERIDOL 2.5 MG/ML IJ SOLN: 0.6250 mg | Freq: Once | INTRAMUSCULAR | Status: DC | PRN

## 2024-02-27 MED ORDER — EPHEDRINE 5 MG/ML INJ
INTRAVENOUS | Status: AC
  Filled 2024-02-27: qty 5

## 2024-02-27 MED ORDER — METOCLOPRAMIDE HCL 5 MG/ML IJ SOLN: 5.0000 mg | Freq: Three times a day (TID) | INTRAMUSCULAR | Status: DC | PRN

## 2024-02-27 MED ORDER — VASOPRESSIN 20 UNIT/ML IV SOLN
INTRAVENOUS | Status: AC
  Filled 2024-02-27: qty 1

## 2024-02-27 MED ORDER — EPHEDRINE SULFATE-NACL 50-0.9 MG/10ML-% IV SOSY
PREFILLED_SYRINGE | INTRAVENOUS | Status: DC | PRN
  Administered 2024-02-27 (×2): 10 mg via INTRAVENOUS

## 2024-02-27 MED ORDER — HYDROMORPHONE HCL 1 MG/ML IJ SOLN
1.0000 mg | INTRAMUSCULAR | Status: DC | PRN
  Administered 2024-02-27 – 2024-02-28 (×4): 1 mg via INTRAVENOUS
  Filled 2024-02-27 (×4): qty 1

## 2024-02-27 MED ORDER — DEXTROSE-SODIUM CHLORIDE 5-0.9 % IV SOLN: INTRAVENOUS | Status: AC

## 2024-02-27 MED ORDER — DIPHENHYDRAMINE HCL 12.5 MG/5ML PO ELIX: 12.5000 mg | ORAL_SOLUTION | ORAL | Status: DC | PRN

## 2024-02-27 MED ORDER — ACETAMINOPHEN 10 MG/ML IV SOLN: 1000.0000 mg | Freq: Once | INTRAVENOUS | Status: DC | PRN

## 2024-02-27 MED ORDER — ONDANSETRON HCL 4 MG PO TABS
4.0000 mg | ORAL_TABLET | Freq: Four times a day (QID) | ORAL | Status: DC | PRN
  Administered 2024-03-02 – 2024-03-06 (×2): 4 mg via ORAL
  Filled 2024-02-27 (×3): qty 1

## 2024-02-27 MED ORDER — PROPOFOL 1000 MG/100ML IV EMUL
INTRAVENOUS | Status: AC
  Filled 2024-02-27: qty 100

## 2024-02-27 MED ORDER — ENOXAPARIN SODIUM 40 MG/0.4ML IJ SOSY
40.0000 mg | PREFILLED_SYRINGE | INTRAMUSCULAR | Status: DC
  Administered 2024-02-28: 40 mg via SUBCUTANEOUS
  Filled 2024-02-27: qty 0.4

## 2024-02-27 MED ORDER — OXYCODONE HCL 5 MG PO TABS
5.0000 mg | ORAL_TABLET | ORAL | Status: DC | PRN
  Administered 2024-02-27 – 2024-02-28 (×3): 10 mg via ORAL
  Administered 2024-02-28: 5 mg via ORAL
  Filled 2024-02-27 (×2): qty 2
  Filled 2024-02-27: qty 1

## 2024-02-27 MED ORDER — BUPIVACAINE LIPOSOME 1.3 % IJ SUSP
INTRAMUSCULAR | Status: DC | PRN
  Administered 2024-02-27: 10 mL

## 2024-02-27 MED ORDER — DEXAMETHASONE SODIUM PHOSPHATE 10 MG/ML IJ SOLN
INTRAMUSCULAR | Status: AC
Start: 2024-02-27 — End: ?
  Filled 2024-02-27: qty 1

## 2024-02-27 MED ORDER — INSULIN ASPART 100 UNIT/ML IJ SOLN
INTRAMUSCULAR | Status: AC
  Filled 2024-02-27: qty 1

## 2024-02-27 MED ORDER — MAGNESIUM HYDROXIDE 400 MG/5ML PO SUSP: 30.0000 mL | Freq: Every day | ORAL | Status: DC | PRN

## 2024-02-27 MED ORDER — CHLORHEXIDINE GLUCONATE 0.12 % MT SOLN: 15.0000 mL | Freq: Once | OROMUCOSAL | Status: DC

## 2024-02-27 MED ORDER — OXYCODONE HCL 5 MG PO TABS
ORAL_TABLET | ORAL | Status: AC
  Filled 2024-02-27: qty 2

## 2024-02-27 MED ORDER — DOCUSATE SODIUM 100 MG PO CAPS
100.0000 mg | ORAL_CAPSULE | Freq: Two times a day (BID) | ORAL | Status: DC
  Administered 2024-02-28 – 2024-03-01 (×3): 100 mg via ORAL
  Filled 2024-02-27 (×3): qty 1

## 2024-02-27 MED ORDER — SODIUM CHLORIDE (PF) 0.9 % IJ SOLN
INTRAMUSCULAR | Status: AC
  Filled 2024-02-27: qty 10

## 2024-02-27 MED ORDER — CHLORHEXIDINE GLUCONATE 0.12 % MT SOLN
OROMUCOSAL | Status: AC
  Filled 2024-02-27: qty 15

## 2024-02-27 MED ORDER — ALBUMIN HUMAN 5 % IV SOLN
INTRAVENOUS | Status: AC
  Filled 2024-02-27: qty 250

## 2024-02-27 MED ORDER — MIDAZOLAM HCL 2 MG/2ML IJ SOLN
INTRAMUSCULAR | Status: DC | PRN
  Administered 2024-02-27: 2 mg via INTRAVENOUS

## 2024-02-27 SURGICAL SUPPLY — 44 items
BIT DRILL 4.3MMS DISTAL GRDTED (BIT) IMPLANT
BNDG COHESIVE 4X5 TAN STRL LF (GAUZE/BANDAGES/DRESSINGS) ×1 IMPLANT
BNDG COHESIVE 6X5 TAN ST LF (GAUZE/BANDAGES/DRESSINGS) ×1 IMPLANT
CHLORAPREP W/TINT 26 (MISCELLANEOUS) ×2 IMPLANT
DRAPE C-ARMOR (DRAPES) ×1 IMPLANT
DRAPE SHEET LG 3/4 BI-LAMINATE (DRAPES) ×1 IMPLANT
DRILL 4.3MMS DISTAL GRADUATED (BIT) ×1 IMPLANT
DRSG MEPILEX SACRM 8.7X9.8 (GAUZE/BANDAGES/DRESSINGS) ×1 IMPLANT
DRSG OPSITE POSTOP 3X4 (GAUZE/BANDAGES/DRESSINGS) IMPLANT
DRSG OPSITE POSTOP 4X6 (GAUZE/BANDAGES/DRESSINGS) IMPLANT
ELECT CAUTERY BLADE 6.4 (BLADE) ×1 IMPLANT
ELECT REM PT RETURN 9FT ADLT (ELECTROSURGICAL) ×1 IMPLANT
ELECTRODE REM PT RTRN 9FT ADLT (ELECTROSURGICAL) ×1 IMPLANT
GAUZE SPONGE 4X4 12PLY STRL (GAUZE/BANDAGES/DRESSINGS) ×1 IMPLANT
GLOVE BIO SURGEON STRL SZ8 (GLOVE) ×2 IMPLANT
GLOVE INDICATOR 8.0 STRL GRN (GLOVE) ×1 IMPLANT
GOWN STRL REUS W/ TWL LRG LVL3 (GOWN DISPOSABLE) ×1 IMPLANT
GOWN STRL REUS W/ TWL XL LVL3 (GOWN DISPOSABLE) ×1 IMPLANT
GUIDEPIN VERSANAIL DSP 3.2X444 (ORTHOPEDIC DISPOSABLE SUPPLIES) IMPLANT
GUIDEWIRE BALL NOSE 100CM (WIRE) IMPLANT
HANDLE YANKAUER SUCT OPEN TIP (MISCELLANEOUS) ×1 IMPLANT
HFN RH 130 DEG 11MM X 360MM (Orthopedic Implant) IMPLANT
MANIFOLD NEPTUNE II (INSTRUMENTS) ×1 IMPLANT
MAT ABSORB FLUID 56X50 GRAY (MISCELLANEOUS) ×1 IMPLANT
NDL FILTER BLUNT 18X1 1/2 (NEEDLE) ×1 IMPLANT
NDL HYPO 22X1.5 SAFETY MO (MISCELLANEOUS) ×1 IMPLANT
NEEDLE FILTER BLUNT 18X1 1/2 (NEEDLE) ×1 IMPLANT
NEEDLE HYPO 22X1.5 SAFETY MO (MISCELLANEOUS) ×1 IMPLANT
NS IRRIG 500ML POUR BTL (IV SOLUTION) ×1 IMPLANT
PACK HIP COMPR (MISCELLANEOUS) ×1 IMPLANT
PENCIL SMOKE EVACUATOR (MISCELLANEOUS) ×1 IMPLANT
SCREW BONE CORTICAL 5.0X38 (Screw) IMPLANT
SCREW CANN THRD AFF 10.5X100 (Screw) IMPLANT
SCREWDRIVER HEX TIP 3.5MM (MISCELLANEOUS) IMPLANT
STAPLER SKIN PROX 35W (STAPLE) ×1 IMPLANT
STRAP SAFETY 5IN WIDE (MISCELLANEOUS) ×1 IMPLANT
SUT VIC AB 0 CT1 36 (SUTURE) ×1 IMPLANT
SUT VIC AB 1 CT1 36 (SUTURE) ×1 IMPLANT
SUT VIC AB 2-0 CT1 (SUTURE) ×2 IMPLANT
SYR 10ML LL (SYRINGE) ×1 IMPLANT
SYR 30ML LL (SYRINGE) ×1 IMPLANT
TAPE MICROFOAM 4IN (TAPE) ×1 IMPLANT
TRAP FLUID SMOKE EVACUATOR (MISCELLANEOUS) ×1 IMPLANT
WATER STERILE IRR 500ML POUR (IV SOLUTION) ×1 IMPLANT

## 2024-02-27 NOTE — Anesthesia Procedure Notes (Signed)
 Procedure Name: Intubation Date/Time: 02/27/2024 1:05 PM  Performed by: Ellwood Haber, CRNAPre-anesthesia Checklist: Patient identified, Patient being monitored, Timeout performed, Emergency Drugs available and Suction available Patient Re-evaluated:Patient Re-evaluated prior to induction Oxygen Delivery Method: Circle system utilized Preoxygenation: Pre-oxygenation with 100% oxygen Induction Type: IV induction Ventilation: Mask ventilation without difficulty Laryngoscope Size: 3 and McGrath Grade View: Grade I Tube type: Oral Tube size: 7.0 mm Number of attempts: 1 Airway Equipment and Method: Stylet Placement Confirmation: ETT inserted through vocal cords under direct vision, positive ETCO2 and breath sounds checked- equal and bilateral Secured at: 22 cm Tube secured with: Tape Dental Injury: Teeth and Oropharynx as per pre-operative assessment  Comments: Smooth atraumatic intubation, no complications noted.

## 2024-02-27 NOTE — Progress Notes (Signed)
 PROGRESS NOTE    Craig Banks  ZOX:[id number] DOB: Mar 25, 1966 DOA: 02/26/2024 PCP: Craig Reining, PA-C  Chief Complaint  Patient presents with   [facility] Course:  Craig Banks is 58 y.o. male with insulin-dependent diabetes, who presented with mechanical fall while trying to catch a dog and subsequently sustained a right femur fracture.  Fracture confirmed by x-ray in the ED.  Labs and vitals mostly unremarkable except for hyperglycemia 253.  Patient received Dilaudid and orthopedic surgery was consulted.  CT scan was ordered but patient reported he was unable to tolerate  Subjective: Patient evaluated postoperatively, he reports his pain is better now but is requesting for his next dose of Dilaudid.  He is also endorsing right ankle pain.  This has not yet been imaged.  He reports it has been swollen since the fall.  He also has had some right thigh swelling.   Objective: Vitals:   02/26/24 2040 02/26/24 2204 02/27/24 0506 02/27/24 0806  BP: 104/81 (!) 96/59 (!) 101/48 (!) 108/57  Pulse: (!) 118 (!) 51 65 73  Resp: 18 19 18 16   Temp: 98 F (36.7 C) 98.6 F (37 C) 98.6 F (37 C) 98.4 F (36.9 C)  TempSrc: Oral Oral  Oral  SpO2: 98% 96% 99% 90%  Height:  5' 7.01" (1.702 m)      Intake/Output Summary (Last 24 hours) at 02/27/2024 0855 Last data filed at 02/27/2024 0700 Gross per 24 hour  Intake --  Output 700 ml  Net -700 ml   There were no vitals filed for this visit.  Examination: General exam: Appears calm and comfortable, NAD  Respiratory system: No work of breathing, symmetric chest wall expansion Cardiovascular system: S1 & S2 heard, RRR.  Gastrointestinal system: Abdomen is nondistended, soft and nontender.  Neuro: Alert and oriented. No focal neurological deficits. Extremities: Right leg with postoperative bandages, right ankle with significant edema to the right malleolus, very tender to palpation. Skin: No rashes, lesions Psychiatry:  Demonstrates appropriate judgement and insight. Mood & affect appropriate for situation.   Assessment & Plan:  Principal Problem:   Femur fracture, right (HCC) Active Problems:   Closed displaced intertrochanteric fracture of right femur (HCC)    Right proximal femur intertrochanteric  fracture - 4/17 reduction and internal fixation of displaced four-part intertrochanteric right hip fracture - PT/OT per orthopedic surgery  Right ankle swelling - Will obtain plain film.  May have sustained additional injury during the fall  Right thigh swelling - Anesthesiology in preop noted right leg swelling greater than expected for femur fracture and with bedside ultrasound had concerns of DVT.  Doppler has been ordered and pending.  Insulin-dependent uncontrolled diabetes with hyperglycemia - Hemoglobin A1c 8.2% - Continue basal/bolus/Lantus scale insulin.  Titrate up as tolerated. - Patient reports he has recently been eating poorly contributing to his higher A1c.  Reports he has previously been closer to 7%  Anxiety Depression - Resume home meds  DVT prophylaxis: Lovenox Disposition: Inpatient, still hospitalized for pain management, postoperative monitoring.  Will eventually discharge to home.  Likely outpatient PT  Consultants:  Treatment Team:  Consulting Physician: Craig Berber, MD  Procedures:    Antimicrobials:  Anti-infectives (From admission, onward)    None       Data Reviewed: I have personally reviewed following labs and imaging studies CBC: Recent Labs  Lab 02/26/24 1636  WBC 9.6  NEUTROABS 7.2  HGB 13.5  HCT 40.3  MCV 90.6  PLT 269   Basic Metabolic Panel: Recent Labs  Lab 02/26/24 1636  NA 137  K 4.9  CL 105  CO2 25  GLUCOSE 253*  BUN 19  CREATININE 1.01  CALCIUM 8.7*   GFR: CrCl cannot be calculated (Unknown ideal weight.). Liver Function Tests: No results for input(s): "AST", "ALT", "ALKPHOS", "BILITOT", "PROT", "ALBUMIN" in the  last 168 hours. CBG: Recent Labs  Lab 02/26/24 1836 02/26/24 2042 02/27/24 0742  GLUCAP 214* 259* 134*    Recent Results (from the past 240 hours)  Surgical PCR screen     Status: None   Collection Time: 02/27/24  4:04 AM   Specimen: Nasal Mucosa; Nasal Swab  Result Value Ref Range Status   MRSA, PCR NEGATIVE NEGATIVE Final   Staphylococcus aureus NEGATIVE NEGATIVE Final    Comment: (NOTE) The Xpert SA Assay (FDA approved for NASAL specimens in patients 72 years of age and older), is one component of a comprehensive surveillance program. It is not intended to diagnose infection nor to guide or monitor treatment. Performed at [facility], [address] [place], [place] [address]      Radiology Studies: DG HIP PORT UNILAT WITH PELVIS 1V RIGHT Result Date: 02/26/2024 CLINICAL DATA:  Pain after fall. EXAM: DG HIP (WITH OR WITHOUT PELVIS) 1V PORT RIGHT COMPARISON:  None Available. FINDINGS: Single frontal view of the right hip submitted. Comminuted, displaced and angulated subtrochanteric femur fracture. There is also involvement of the lesser trochanter. Femoral head remains seated. IMPRESSION: Comminuted, displaced and angulated subtrochanteric and intertrochanteric femur fracture. Electronically Signed   By: Craig Colonel M.D.   On: 02/26/2024 18:56    Scheduled Meds:  aspirin EC  81 mg Oral Daily   enoxaparin (LOVENOX) injection  40 mg Subcutaneous Q24H   insulin aspart  0-20 Units Subcutaneous TID WC   insulin aspart  0-5 Units Subcutaneous QHS   insulin glargine-yfgn  10 Units Subcutaneous Daily   traZODone  100 mg Oral QHS   Continuous Infusions:   LOS: 1 day  MDM: Patient is high risk for one or more organ failure.  They necessitate ongoing hospitalization for continued IV therapies and subsequent lab monitoring. Total time spent interpreting labs and vitals, coordinating care amongst consultants and care team members, directly assessing and discussing  care with the patient and/or family: 55 min    Craig Bettendorf, DO [facility]  To contact the attending physician between 7A-7P please use Epic Chat. To contact the covering physician during after hours 7P-7A, please review Amion.   02/27/2024, 8:55 AM   *This document has been created with the assistance of dictation software. Please excuse typographical errors. *

## 2024-02-27 NOTE — Progress Notes (Signed)
 Nutrition Brief Note  RD consulted for assessment of nutritional status/ needs secondary to hip fracture.   Wt Readings from Last 15 Encounters:  07/04/23 78.9 kg  06/05/23 78.9 kg  04/23/23 78.9 kg  04/19/23 77.1 kg  12/31/22 78.9 kg  12/25/22 78.5 kg  05/03/22 85.7 kg  08/01/21 88.5 kg  04/19/21 93 kg  03/28/21 90.7 kg  06/14/20 92.5 kg  03/08/20 94.8 kg  12/09/19 94.1 kg  08/20/19 93 kg  05/21/19 94.8 kg   Pt with medical history significant of IDDM, presented with mechanical fall and right femur fracture.   Pt admitted with rt femur fracture s/p mechanical fall.   Reviewed I/O's: -700 ml x 24 hours  Pt down in OR at time of visit. No family present in room at time of visit.   RD will obtain new wt. Most recent wt was 168# on 12/24/23. Per MST screen, pt did not report any weight loss PTA.   Medications reviewed and include 0.9% sodium chloride infusion @ 10 ml/hr.   Lab Results  Component Value Date   HGBA1C 8.2 (H) 02/26/2024   PTA DM medications are 15 units insulin glargine BID and 3 units insulin lispro TID. Noted uncontrolled DM; unsure of PCP or endocrinologist. RD will refer pt to [facility] for further reinforcement. RD will also provided "Plate Method" and "Carbohydrate Counting for People with Diabetes" handouts from [facility] Nutrition Care Manual; attached to AVS/ discharge summary.   Labs reviewed: CBGS:  (inpatient orders for glycemic control are 0-20 units insulin aspart TID with meals, 0-5 units insulin aspart daily at bedtime, and 10 units inuslin glargine-yfgn daily).    Current diet order is NPO, patient is consuming approximately n/a% of meals at this time. Labs and medications reviewed.   No nutrition interventions warranted at this time. If nutrition issues arise, please consult RD.   Herschel Lords, RD, LDN, CDCES Registered Dietitian III Certified Diabetes Care and Education Specialist If unable to reach  this RD, please use "RD Inpatient" group chat on secure chat between hours of 8am-4 pm daily

## 2024-02-27 NOTE — Progress Notes (Signed)
 Patient gave verbal consent for nurse to cut off shorts and underwear.

## 2024-02-27 NOTE — Op Note (Signed)
 02/27/2024  3:07 PM  Patient:   Craig Banks  Pre-Op Diagnosis:   Displaced intertrochanteric fracture right hip.  Post-Op Diagnosis:   Same  Procedure:   Reduction and internal fixation of displaced four-part intertrochanteric right hip fracture with Biomet Affixis TFN nail.  Surgeon:   Maryagnes Amos, MD  Assistant:   Martie Round, PA-S  Anesthesia:   Spinal  Findings:   As above  Complications:   None  EBL:   100 cc  Fluids:   1400 cc crystalloid, 250 cc of albumin  UOP:   None  TT:   None  Drains:   None  Closure:   Staples  Implants:   Biomet Affixis 11 x 360 mm TFN with a 100 mm lag screw and two 38 mm distal interlocking screws  Brief Clinical Note:   The patient is a 58 year old male who sustained above-noted injury last evening when he fell onto his right side while trying to corral a loose dog [place] a local dog [place]. The patient was brought to the emergency room where x-rays demonstrated the above-noted injury. The patient has been cleared medically and presents at this time for reduction and internal fixation of the displaced intertrochanteric right hip fracture.  Procedure:   The patient was brought into the operating room and lain in the supine position. After adequate general endotracheal intubation and anesthesia were obtained, the patient was to the fracture table. The uninjured leg was placed in a flexed and abducted position while the injured lower extremity was placed in longitudinal traction. The fracture was reduced using longitudinal traction and internal rotation. The adequacy of reduction was verified fluoroscopically in AP and lateral projections and found to be near anatomic. The lateral aspects of the right hip and thigh were prepped with ChloraPrep solution before being draped sterilely. Preoperative antibiotics were administered. A timeout was performed to verify the appropriate surgical site.   The greater trochanter was identified  fluoroscopically and an approximately 5-6 cm incision made about 2-3 fingerbreadths above the tip of the greater trochanter. The incision was carried down through the subcutaneous tissues to expose the gluteal fascia. This was split the length of the incision, providing access to the tip of the trochanter. Under fluoroscopic guidance, a guidewire was drilled through the tip of the trochanter into the proximal metaphysis to the level of the lesser trochanter. After verifying its position fluoroscopically in AP and lateral projections, it was overreamed with the initial reamer to the depth of the lesser trochanter. A guidewire was passed down through the femoral canal to the supracondylar region. The adequacy of guidewire position was verified fluoroscopically in AP and lateral projections before the length of the guidewire within the canal was measured and found to be 390 mm. Therefore, a 360 mm length nail was selected. The guidewire was overreamed sequentially using the flexible reamers, beginning with a 10.5 mm reamer and progressing to a 13 mm reamer. This provided good cortical chatter. The 11 x 360 mm Biomet Affixis TFN rod was selected and advanced to the appropriate depth, as verified fluoroscopically.   The guide system for the lag screw was positioned and advanced through an approximately 2 cm stab incision over the lateral aspect of the proximal femur. The guidewire was drilled up through the trochanteric femoral nail and into the femoral neck to rest within 5 mm of subchondral bone. After verifying its position in the femoral neck and head in both AP and lateral projections, the guidewire was  measured and found to be optimally replicated by a 100 mm lag screw. The guidewire was overreamed to the appropriate depth before the lag screw was inserted and advanced to the appropriate depth as verified fluoroscopically in AP and lateral projections. The locking screw was advanced, then backed off a quarter  turn to set the lag screw. Again the adequacy of hardware position and fracture reduction was verified fluoroscopically in AP and lateral projections and found to be excellent.  Attention was directed distally. Using the "perfect circle" technique, the leg and fluoroscopy machine were positioned appropriately. An approximately 1.5 cm stab incision was made over the skin at the appropriate point before the drill bit was advanced through the cortex and across the static hole of the nail. The appropriate length of the screw was determined before the 38 mm distal interlocking screw was positioned, then advanced and tightened securely.  Because the patient had a reverse obliquity/subtrochanteric extension, it is felt prudent to put in a second distal interlocking screw. Therefore, again using the "perfect circle" technique, an approximate 1.5 cm stab incision was made over the skin at the appropriate point before the drill bit was advanced to the cortex and across the dynamic hole of the nail. The appropriate length of the screw was determined for the 38 mm distal interlocking screw was positioned then advanced and tightened securely. The adequacy of screw positions were verified fluoroscopically in AP and lateral projections and found to be excellent.  The wounds were irrigated thoroughly with sterile saline solution before the abductor fascia was reapproximated using #1 Vicryl interrupted sutures. The subcutaneous tissues were closed using 2-0 Vicryl interrupted sutures. The skin was closed using staples. A solution of 20 cc of 0.5% Sensorcaine with epinephrine and 10 cc of Exparel was injected in and around all incisions. Sterile occlusive dressings were applied to all wounds before the patient was awakened, extubated, and returned to the recovery room in satisfactory condition after tolerating the procedure well.

## 2024-02-27 NOTE — Anesthesia Preprocedure Evaluation (Addendum)
 Anesthesia Evaluation  Patient identified by MRN, date of birth, ID band Patient awake    Reviewed: Allergy & Precautions, H&P , NPO status , Patient's Chart, lab work & pertinent test results  Airway Mallampati: II  TM Distance: >3 FB Neck ROM: full    Dental no notable dental hx.    Pulmonary neg pulmonary ROS   Pulmonary exam normal        Cardiovascular + dysrhythmias (PVC's)  Rate:Tachycardia     Neuro/Psych negative neurological ROS  negative psych ROS   GI/Hepatic negative GI ROS, Neg liver ROS,,,  Endo/Other  diabetes, Poorly Controlled, Type 1, Insulin Dependent    Renal/GU      Musculoskeletal   Abdominal   Peds  Hematology negative hematology ROS (+)   Anesthesia Other Findings    Past Medical History: No date: Diabetes (HCC) No date: Diabetes mellitus without complication (HCC)     Comment:  age 58 No date: Erectile dysfunction 2006: Proteinuria  Past Surgical History: 02/18/15: CHOLECYSTECTOMY 2010: SHOULDER SURGERY; Left 1995: WRIST SURGERY; Right  BMI    Body Mass Index: 27.25 kg/m      Reproductive/Obstetrics negative OB ROS                              Anesthesia Physical Anesthesia Plan  ASA: 2  Anesthesia Plan: General ETT   Post-op Pain Management: Tylenol PO (pre-op)*, Gabapentin PO (pre-op)*, Celebrex PO (pre-op)* and Regional block*   Induction: Intravenous  PONV Risk Score and Plan: 2 and Ondansetron, Dexamethasone and Midazolam  Airway Management Planned: Oral ETT  Additional Equipment:   Intra-op Plan:   Post-operative Plan: Extubation in OR  Informed Consent: I have reviewed the patients History and Physical, chart, labs and discussed the procedure including the risks, benefits and alternatives for the proposed anesthesia with the patient or authorized representative who has indicated his/her understanding and acceptance.      Dental Advisory Given  Plan Discussed with: CRNA and Surgeon  Anesthesia Plan Comments: (Lovenox given today so using a spinal anesthetic is contraindicated. Pt's thigh is large compared to left. He has pain in his heel. His pulses DP and PT are 2+. He is tachycardic and agitated with pain. His pain was treated pre-op.)         Anesthesia Quick Evaluation

## 2024-02-27 NOTE — Transfer of Care (Signed)
 Immediate Anesthesia Transfer of Care Note  Patient: Craig Banks  Procedure(s) Performed: FIXATION, FRACTURE, INTERTROCHANTERIC, WITH INTRAMEDULLARY ROD (Right)  Patient Location: PACU  Anesthesia Type:General  Level of Consciousness: awake, alert , and drowsy  Airway & Oxygen Therapy: Patient Spontanous Breathing and Patient connected to face mask oxygen  Post-op Assessment: Report given to RN and Post -op Vital signs reviewed and stable  Post vital signs: Reviewed and stable  Last Vitals:  Vitals Value Taken Time  BP 147/66 02/27/24 1445  Temp 37 C 02/27/24 1445  Pulse 58 02/27/24 1451  Resp 22 02/27/24 1451  SpO2 100 % 02/27/24 1451  Vitals shown include unfiled device data.  Last Pain:  Vitals:   02/27/24 1445  TempSrc:   PainSc: 0-No pain         Complications: No notable events documented.

## 2024-02-27 NOTE — Consult Note (Signed)
 ORTHOPAEDIC CONSULTATION  REQUESTING PHYSICIAN: Dezii, Alexandra, DO  Chief Complaint:   Right hip pain  History of Present Illness: Craig Banks is a 58 y.o. male with a history of diabetes who apparently was at work at a [employer].  As he open the gate, one of the dogs tried to escape.  While trying to prevent this escape.  He apparently lost his balance and fell onto his right side, injuring his right hip.  He was brought to the emergency room where x-rays demonstrated the above-noted injury.  The patient has been cleared medically and presents at this time for definitive management of his injury.  The patient denies any associated injuries.  He did not strike his head or lose consciousness.  He also denies any lightheadedness, dizziness, chest pain, shortness of breath, or other symptoms which may have precipitated his fall.  Past Medical History:  Diagnosis Date   Diabetes (HCC)    Diabetes mellitus without complication [facility])    age 19   Erectile dysfunction    Proteinuria 2006   Past Surgical History:  Procedure Laterality Date   CHOLECYSTECTOMY  02/18/15   SHOULDER SURGERY Left 2010   WRIST SURGERY Right 1995   Social History   Socioeconomic History   Marital status: Single    Spouse name: Not on file   Number of children: Not on file   Years of education: Not on file   Highest education level: Not on file  Occupational History   Not on file  Tobacco Use   Smoking status: Never   Smokeless tobacco: Never  Vaping Use   Vaping status: Never Used  Substance and Sexual Activity   Alcohol use: Yes    Alcohol/week: 0.0 standard drinks of alcohol   Drug use: No   Sexual activity: Not on file  Other Topics Concern   Not on file  Social History Narrative   Not on file   Social Drivers of Health   Financial Resource Strain: Not on file  Food Insecurity: No Food Insecurity (02/27/2024)    Hunger Vital Sign    Worried About Running Out of Food in the Last Year: Never true    Ran Out of Food in the Last Year: Never true  Transportation Needs: No Transportation Needs (02/27/2024)   PRAPARE - Administrator, Civil Service (Medical): No    Lack of Transportation (Non-Medical): No  Physical Activity: Not on file  Stress: Not on file  Social Connections: Not on file   Family History  Problem Relation Age of Onset   Cancer Mother    Diabetes Sister    No Known Allergies Prior to Admission medications   Medication Sig Start Date End Date Taking? Authorizing Provider  oxyCODONE (OXY IR/ROXICODONE) 5 MG immediate release tablet Take 5 mg by mouth every 4 (four) hours as needed for moderate pain (pain score 4-6), severe pain (pain score 7-10) or breakthrough pain. 01/16/24  Yes [provider]  aspirin EC 81 MG tablet Take 81 mg by mouth daily.    [provider]  Blood Glucose Monitoring Suppl (FREESTYLE FREEDOM LITE) w/Device KIT Use to test four times daily 08/01/21   Marcina Severe, PA-C  Continuous Glucose Sensor (FREESTYLE LIBRE 3 SENSOR) MISC USE AS DIRECTED EVERY 14 DAYS 07/24/23   Marcina Severe, PA-C  glucose blood test strip Use as instructed 03/08/20   Marcina Severe, PA-C  insulin glargine (LANTUS SOLOSTAR) 100 UNIT/ML Solostar Pen  Inject 15 Units into the skin 2 (two) times daily. Patient taking differently: Inject 15 Units into the skin at bedtime. 07/24/23   Marcina Severe, PA-C  insulin lispro (HUMALOG) 100 UNIT/ML injection Inject 3 Units into the skin 3 (three) times daily before meals. SSI    [provider]  Insulin Syringe-Needle U-100 (BD INSULIN SYRINGE U/F) 31G X 5/16" 0.3 ML MISC USE AS DIRECTED UP TO SIX INJECTIONS DAILY 09/24/23   Marcina Severe, PA-C  NONFORMULARY OR COMPOUNDED ITEM Trimix (30/1/10)-(Pap/Phent/PGE)  Test Dose  1ml vial   Qty #3 Refills 0  [facility] [phone] Fax [phone] 06/05/23    Dustin Gimenez, MD  NONFORMULARY OR COMPOUNDED ITEM Trimix (30/1/10)-(Pap/Phent/PGE)  Dosage: Inject 0.2 cc per injection   Vial 1ml  Qty #10 Refills 6  [facility] [phone] Fax [phone] 07/04/23   Matilde Son A, PA-C  NOVOLOG 100 UNIT/ML injection AS DIRECTED FOR SLIDING SCALE 09/24/23   Marcina Severe, PA-C  traMADol-acetaminophen (ULTRACET) 37.5-325 MG tablet Take 1 tablet by mouth every 6 (six) hours as needed. 11/18/23   Marcina Severe, PA-C  traZODone (DESYREL) 100 MG tablet Take 1 tablet (100 mg total) by mouth at bedtime. 04/23/23   Scarboro, Adam J, NP  VIAGRA 100 MG tablet Take 1 tablet (100 mg total) by mouth See admin instructions. 04/23/23   Scarboro, Adam J, NP  zolpidem (AMBIEN) 10 MG tablet Take 1 tablet (10 mg total) by mouth at bedtime as needed for sleep. 05/20/23 06/19/23  Marcina Severe, PA-C   DG C-Arm 1-60 Min-No Report Result Date: 02/27/2024 Fluoroscopy was utilized by the requesting physician.  No radiographic interpretation.   DG C-Arm 1-60 Min-No Report Result Date: 02/27/2024 Fluoroscopy was utilized by the requesting physician.  No radiographic interpretation.   DG HIP PORT UNILAT WITH PELVIS 1V RIGHT Result Date: 02/26/2024 CLINICAL DATA:  Pain after fall. EXAM: DG HIP (WITH OR WITHOUT PELVIS) 1V PORT RIGHT COMPARISON:  None Available. FINDINGS: Single frontal view of the right hip submitted. Comminuted, displaced and angulated subtrochanteric femur fracture. There is also involvement of the lesser trochanter. Femoral head remains seated. IMPRESSION: Comminuted, displaced and angulated subtrochanteric and intertrochanteric femur fracture. Electronically Signed   By: Chadwick Colonel M.D.   On: 02/26/2024 18:56   Positive ROS: All other systems have been reviewed and were otherwise negative with the exception of those mentioned in the HPI and as above.  Physical Exam: General:  Alert, no acute distress Psychiatric:  Patient is competent  for consent with normal mood and affect   Cardiovascular:  No pedal edema Respiratory:  No wheezing, non-labored breathing GI:  Abdomen is soft and non-tender Skin:  No lesions in the area of chief complaint Neurologic:  Sensation intact distally Lymphatic:  No axillary or cervical lymphadenopathy  Orthopedic Exam:  Orthopedic examination is limited to the right hip and lower extremity.  The right lower extremity is held in a shortened and externally rotated position as compared to the right.  There is moderate swelling of the right thigh, but no erythema, ecchymosis, abrasions, or other skin abnormalities are identified.  He has mild-moderate tenderness to palpation over the lateral aspect of the right hip.  He has more severe pain with any attempted active or passive motion of the hip.  He is grossly neurovascularly intact to the right lower extremity and foot.  X-rays:  A single AP view of the right hip is available for review and has been  reviewed by myself.  The findings are as described above.  By my review, there appears to be a displaced intertrochanteric fracture with subtrochanteric extension.  The hip joint appears to be well-maintained and is without evidence for significant degenerative changes.  No lytic lesions or other acute abnormalities are identified, although the findings are somewhat limited due to the single view available.  Assessment: Displaced intertrochanteric/subtrochanteric right hip fracture.  Plan: Given Dr. Noberto Bastos long surgical schedule today, I have offered to take on this patient's surgery on his behalf. The treatment options, including both surgical and nonsurgical choices, have been discussed in detail with the patient and his wife. The patient and his wife would like to proceed with surgical intervention to include an intramedullary nailing of the displaced right hip fracture. The procedure has been discussed in detail with the patient and his wife, as have  the potential risks (including bleeding, infection, nerve and/or blood vessel injury, persistent or recurrent pain, loosening or failure of the components, leg length inequality, dislocation, need for further surgery, blood clots, strokes, heart attacks or arrhythmias, pneumonia, etc.) and benefits. The patient states his understanding and agrees to proceed. A formal written consent will be obtained by the nursing staff.  Thank you for asking me to participate in the care of this most pleasant young unfortunate man.  I will be happy to follow him with you.   Craig Roberts, MD  Beeper #:  [phone]  02/27/2024 3:36 PM

## 2024-02-27 NOTE — Discharge Instructions (Signed)

## 2024-02-27 NOTE — Plan of Care (Signed)

## 2024-02-28 ENCOUNTER — Encounter: Payer: Self-pay | Admitting: Surgery

## 2024-02-28 DIAGNOSIS — I82451 Acute embolism and thrombosis of right peroneal vein: Secondary | ICD-10-CM

## 2024-02-28 DIAGNOSIS — S72141A Displaced intertrochanteric fracture of right femur, initial encounter for closed fracture: Secondary | ICD-10-CM | POA: Diagnosis not present

## 2024-02-28 LAB — GLUCOSE, CAPILLARY
Glucose-Capillary: 334 mg/dL — ABNORMAL HIGH (ref 70–99)
Glucose-Capillary: 291 mg/dL — ABNORMAL HIGH (ref 70–99)
Glucose-Capillary: 271 mg/dL — ABNORMAL HIGH (ref 70–99)
Glucose-Capillary: 326 mg/dL — ABNORMAL HIGH (ref 70–99)
Glucose-Capillary: 276 mg/dL — ABNORMAL HIGH (ref 70–99)

## 2024-02-28 LAB — CBC
HCT: 27.9 % — ABNORMAL LOW (ref 39.0–52.0)
Hemoglobin: 9.5 g/dL — ABNORMAL LOW (ref 13.0–17.0)
MCH: 31.1 pg (ref 26.0–34.0)
MCHC: 34.1 g/dL (ref 30.0–36.0)
MCV: 91.5 fL (ref 80.0–100.0)
Platelets: 174 10*3/uL (ref 150–400)
RBC: 3.05 MIL/uL — ABNORMAL LOW (ref 4.22–5.81)
RDW: 13 % (ref 11.5–15.5)
WBC: 10.6 10*3/uL — ABNORMAL HIGH (ref 4.0–10.5)
nRBC: 0 % (ref 0.0–0.2)

## 2024-02-28 LAB — BASIC METABOLIC PANEL WITH GFR
Anion gap: 7 (ref 5–15)
BUN: 21 mg/dL — ABNORMAL HIGH (ref 6–20)
CO2: 23 mmol/L (ref 22–32)
Calcium: 7.9 mg/dL — ABNORMAL LOW (ref 8.9–10.3)
Chloride: 102 mmol/L (ref 98–111)
Creatinine, Ser: 0.87 mg/dL (ref 0.61–1.24)
GFR, Estimated: >60 mL/min (ref >60)
Glucose, Bld: 308 mg/dL — ABNORMAL HIGH (ref 70–99)
Potassium: 4.6 mmol/L (ref 3.5–5.1)
Sodium: 132 mmol/L — ABNORMAL LOW (ref 135–145)

## 2024-02-28 MED ORDER — OXYCODONE HCL 5 MG PO TABS
5.0000 mg | ORAL_TABLET | Freq: Four times a day (QID) | ORAL | Status: DC | PRN
  Administered 2024-02-28: 5 mg via ORAL
  Administered 2024-02-29 – 2024-03-06 (×20): 10 mg via ORAL
  Administered 2024-03-06: 5 mg via ORAL
  Filled 2024-02-28: qty 2
  Filled 2024-02-28: qty 1
  Filled 2024-02-28 (×21): qty 2

## 2024-02-28 MED ORDER — ZOLPIDEM TARTRATE 5 MG PO TABS
5.0000 mg | ORAL_TABLET | Freq: Every evening | ORAL | Status: DC | PRN
  Administered 2024-02-28 – 2024-03-05 (×6): 5 mg via ORAL
  Filled 2024-02-28 (×6): qty 1

## 2024-02-28 MED ORDER — INSULIN ASPART 100 UNIT/ML IJ SOLN
0.0000 [IU] | Freq: Three times a day (TID) | INTRAMUSCULAR | Status: DC
  Administered 2024-02-28: 11 [IU] via SUBCUTANEOUS
  Administered 2024-02-29: 5 [IU] via SUBCUTANEOUS
  Administered 2024-02-29: 8 [IU] via SUBCUTANEOUS
  Administered 2024-02-29: 11 [IU] via SUBCUTANEOUS
  Administered 2024-03-01: 3 [IU] via SUBCUTANEOUS
  Filled 2024-02-28 (×5): qty 1

## 2024-02-28 MED ORDER — APIXABAN 5 MG PO TABS
10.0000 mg | ORAL_TABLET | Freq: Two times a day (BID) | ORAL | Status: AC
  Administered 2024-02-28 – 2024-03-05 (×14): 10 mg via ORAL
  Filled 2024-02-28 (×14): qty 2

## 2024-02-28 MED ORDER — INSULIN GLARGINE-YFGN 100 UNIT/ML ~~LOC~~ SOLN
15.0000 [IU] | Freq: Two times a day (BID) | SUBCUTANEOUS | Status: DC
  Administered 2024-02-28 – 2024-03-01 (×4): 15 [IU] via SUBCUTANEOUS
  Filled 2024-02-28 (×4): qty 0.15

## 2024-02-28 MED ORDER — SODIUM CHLORIDE 0.9 % IV BOLUS
250.0000 mL | Freq: Once | INTRAVENOUS | Status: AC
  Administered 2024-02-28: 250 mL via INTRAVENOUS

## 2024-02-28 MED ORDER — APIXABAN 5 MG PO TABS
5.0000 mg | ORAL_TABLET | Freq: Two times a day (BID) | ORAL | Status: DC
  Administered 2024-03-06: 5 mg via ORAL
  Filled 2024-02-28: qty 1

## 2024-02-28 NOTE — Progress Notes (Signed)
 PROGRESS NOTE    Craig Banks  ZOX:[id number] DOB: [birth year] DOA: 02/26/2024 PCP: Marcina Severe, PA-C  Chief Complaint  Patient presents with   Craig Banks Course:  Craig Banks is 58 y.o. male with insulin -dependent diabetes, who presented with mechanical fall while trying to catch a dog and subsequently sustained a right femur fracture.  Fracture confirmed by x-ray in the ED.  Labs and vitals mostly unremarkable except for hyperglycemia 253.  Patient received Dilaudid  and orthopedic surgery was consulted.  CT scan was ordered but patient reported he was unable to tolerate  Subjective: Patient reports feeling bad all over today.  He is unable to further verbalize the specifics.  He did have some low blood pressures this morning after receiving Dilaudid .  I discussed with him that we need to start decreasing the opioids.  He is amendable. Patient reports there is " no way" he could discharge home today   Objective: Vitals:   02/27/24 2350 02/28/24 0251 02/28/24 0554 02/28/24 0953  BP: 100/66 108/70 112/64 (!) 91/56  Pulse: (!) 106 (!) 101 100 71  Resp: 17  16 16   Temp: 98.3 F (36.8 C) 98.5 F (36.9 C) 98.1 F (36.7 C) 97.9 F (36.6 C)  TempSrc: Oral     SpO2: 96% 97% 96% 98%  Weight:   77.5 kg   Height:        Intake/Output Summary (Last 24 hours) at 02/28/2024 1337 Last data filed at 02/28/2024 0933 Gross per 24 hour  Intake 2242.44 ml  Output 600 ml  Net 1642.44 ml   Filed Weights   02/28/24 0554  Weight: 77.5 kg    Examination: General exam: Appears calm and comfortable, NAD  Respiratory system: No work of breathing, symmetric chest wall expansion Cardiovascular system: S1 & S2 heard, RRR.  Gastrointestinal system: Abdomen is nondistended, soft and nontender.  Neuro: Alert and oriented. No focal neurological deficits. Extremities: Right leg with postoperative bandages, TED hose on both legs.  Have removed from right leg Skin: No rashes,  lesions Psychiatry: Demonstrates appropriate judgement and insight. Mood & affect appropriate for situation.   Assessment & Plan:  Principal Problem:   Femur fracture, right (HCC) Active Problems:   Closed displaced intertrochanteric fracture of right femur (HCC)    Right proximal femur intertrochanteric  fracture - 4/17 reduction and internal fixation of displaced four-part intertrochanteric right hip fracture. - No concerns of pathologic fracture per orthopedic surgery - PT/OT as tolerated.  Will need PT at discharge.   -- Patient has recently recovered from right rotator cuff repair with surgical repair 12/30/2023.  This may limit his ability to participate in physical therapy - Discontinue Dilaudid .  Cont with scheduled Toradol , as needed oxycodone , Tylenol .  Patient is currently requesting medication  DVT right lower extremity - Doppler confirms acute DVT in right cheerier tibial and peroneal vein.  This was first observed by anesthesiologist in the preoperative period.  I do suspect that it was present on arrival.  -Patient was initiated on Lovenox  postoperatively.  Have discontinued in favor of Eliquis  below Initiated loading dose Eliquis .  Will need to take for at least 3 months.  Right ankle swelling - X-ray without acute fracture.  Does note effusion.  And heterotopic ossification, suspected from old injury  Uncontrolled Type 1 diabetes with hyperglycemia - Hemoglobin A1c 8.2% - Patient has CGM and would prefer to follow his own sliding scale.  We will adjust accordingly. - Continue basal/bolus/Lantus  scale  insulin .  Titrate up as tolerated. - Patient reports he has recently been eating poorly contributing to his higher A1c.  Reports he has previously been closer to 7%  Anxiety Depression - Resume home meds  DVT prophylaxis: Eliquis  Disposition: Inpatient, still hospitalized for pain management, postoperative monitoring.  Will eventually discharge to home.  Likely  outpatient PT  Consultants:  Treatment Team:  Consulting Physician: Venus Ginsberg, MD  Procedures:    Antimicrobials:  Anti-infectives (From admission, onward)    Start     Dose/Rate Route Frequency Ordered Stop   02/27/24 1800  ceFAZolin  (ANCEF ) IVPB 2g/100 mL premix        2 g 200 mL/hr over 30 Minutes Intravenous Every 6 hours 02/27/24 1553 02/28/24 0024   02/27/24 1229  ceFAZolin  (ANCEF ) IVPB 2g/100 mL premix        2 g 200 mL/hr over 30 Minutes Intravenous 30 min pre-op 02/27/24 1229 02/27/24 1318       Data Reviewed: I have personally reviewed following labs and imaging studies CBC: Recent Labs  Lab 02/26/24 1636 02/27/24 0942 02/28/24 0434  WBC 9.6 9.8 10.6*  NEUTROABS 7.2 6.2  --   HGB 13.5 12.3* 9.5*  HCT 40.3 36.1* 27.9*  MCV 90.6 90.9 91.5  PLT 269 217 174   Basic Metabolic Panel: Recent Labs  Lab 02/26/24 1636 02/27/24 0942 02/28/24 0434  NA 137 137 132*  K 4.9 4.5 4.6  CL 105 105 102  CO2 25 28 23   GLUCOSE 253* 145* 308*  BUN 19 20 21*  CREATININE 1.01 0.95 0.87  CALCIUM  8.7* 8.4* 7.9*   GFR: Estimated Creatinine Clearance: 87.6 mL/min (by C-G formula based on SCr of 0.87 mg/dL). Liver Function Tests: Recent Labs  Lab 02/27/24 0942  AST 34  ALT 25  ALKPHOS 51  BILITOT 0.9  PROT 5.4*  ALBUMIN  3.1*   CBG: Recent Labs  Lab 02/27/24 1740 02/27/24 2151 02/28/24 0738 02/28/24 1017 02/28/24 1143  GLUCAP 177* 234* 334* 291* 271*    Recent Results (from the past 240 hours)  Surgical PCR screen     Status: None   Collection Time: 02/27/24  4:04 AM   Specimen: Nasal Mucosa; Nasal Swab  Result Value Ref Range Status   MRSA, PCR NEGATIVE NEGATIVE Final   Staphylococcus aureus NEGATIVE NEGATIVE Final    Comment: (NOTE) The Xpert SA Assay (FDA approved for NASAL specimens in patients 54 years of age and older), is one component of a comprehensive surveillance program. It is not intended to diagnose infection nor to guide or  monitor treatment. Performed at [facility], [address], [place], [place] [address]      Radiology Studies: DG Ankle 2 Views Right Result Date: 02/27/2024 CLINICAL DATA:  9965 Edema 782.3.  ICD-9-CM EXAM: RIGHT ANKLE - 2 VIEW COMPARISON:  None Available. FINDINGS: No acute fracture. There is remodeling of the talar dome which may relate to avascular necrosis or advanced degenerative change with flattening of the dome and moderate tibiotalar degenerative arthritis. There is subchondral sclerosis and asymmetric joint space narrowing involving the subtalar facets in keeping with moderate degenerative arthritis involving these articulations, not well profiled on this examination. There is heterotopic ossification subjacent to the medial malleolus and an ossific excrescence subjacent to the lateral malleolus of unclear significance, possibly posttraumatic in nature. Right ankle effusion is present. Moderate bimalleolar soft tissue swelling noted, more severe laterally. IMPRESSION: 1. Moderate bimalleolar soft tissue swelling, more severe laterally. 2. Moderate tibiotalar and  subtalar degenerative arthritis, not well profiled on this examination. 3. Heterotopic ossification involving the hindfoot, not optimally profiled on this examination, possibly posttraumatic in nature. If indicated, this would be better delineated with CT imaging. 4. Right ankle effusion. Electronically Signed   By: Worthy Heads M.D.   On: 02/27/2024 19:49   US  Venous Img Lower Unilateral Right (DVT) Result Date: 02/27/2024 CLINICAL DATA:  Edema for 2 hours EXAM: RIGHT LOWER EXTREMITY VENOUS DOPPLER ULTRASOUND TECHNIQUE: Gray-scale sonography with graded compression, as well as color Doppler and duplex ultrasound were performed to evaluate the lower extremity deep venous systems from the level of the common femoral vein and including the common femoral, femoral, profunda femoral, popliteal and calf veins including the  posterior tibial, peroneal and gastrocnemius veins when visible. The superficial great saphenous vein was also interrogated. Spectral Doppler was utilized to evaluate flow at rest and with distal augmentation maneuvers in the common femoral, femoral and popliteal veins. COMPARISON:  None available FINDINGS: Contralateral Common Femoral Vein: Respiratory phasicity is normal and symmetric with the symptomatic side. No evidence of thrombus. Normal compressibility. Common Femoral Vein: No evidence of thrombus. Normal compressibility, respiratory phasicity and response to augmentation. Saphenofemoral Junction: No evidence of thrombus. Normal compressibility and flow on color Doppler imaging. Profunda Femoral Vein: No evidence of thrombus. Normal compressibility and flow on color Doppler imaging. Femoral Vein: No evidence of thrombus. Normal compressibility, respiratory phasicity and response to augmentation. Popliteal Vein: No evidence of thrombus. Normal compressibility, respiratory phasicity and response to augmentation. Calf Veins: Intraluminal filling defect with decreased compressibility and flow of the right posterior tibial and peroneal veins consistent with acute DVT. Superficial Great Saphenous Vein: No evidence of thrombus. Normal compressibility. Venous Reflux:  None. Other Findings:  None. IMPRESSION: Acute DVT of the right posterior tibial and peroneal veins. Electronically Signed   By: Elester Grim M.D.   On: 02/27/2024 17:31   DG HIP UNILAT WITH PELVIS 2-3 VIEWS RIGHT Result Date: 02/27/2024 CLINICAL DATA:  Elective surgery. Right proximal femoral intramedullary nail surgery. EXAM: DG HIP (WITH OR WITHOUT PELVIS) 2-3V RIGHT COMPARISON:  Right hip radiographs 02/26/2024 FINDINGS: Images were performed intraoperatively without the presence of a radiologist. Interval long cephalomedullary nail fixation of the previously seen proximal femoral diaphyseal fracture. Improved alignment. Total fluoroscopy  images: 4 Total fluoroscopy time: 70 seconds Total dose: Radiation Exposure Index (as provided by the fluoroscopic device): 13.6 mGy air Kerma Please see intraoperative findings for further detail. IMPRESSION: Intraoperative fluoroscopy for right proximal femoral intramedullary nail fixation. Electronically Signed   By: Bertina Broccoli M.D.   On: 02/27/2024 17:00   DG C-Arm 1-60 Min-No Report Result Date: 02/27/2024 Fluoroscopy was utilized by the requesting physician.  No radiographic interpretation.   DG C-Arm 1-60 Min-No Report Result Date: 02/27/2024 Fluoroscopy was utilized by the requesting physician.  No radiographic interpretation.   DG HIP PORT UNILAT WITH PELVIS 1V RIGHT Result Date: 02/26/2024 CLINICAL DATA:  Pain after fall. EXAM: DG HIP (WITH OR WITHOUT PELVIS) 1V PORT RIGHT COMPARISON:  None Available. FINDINGS: Single frontal view of the right hip submitted. Comminuted, displaced and angulated subtrochanteric femur fracture. There is also involvement of the lesser trochanter. Femoral head remains seated. IMPRESSION: Comminuted, displaced and angulated subtrochanteric and intertrochanteric femur fracture. Electronically Signed   By: Chadwick Colonel M.D.   On: 02/26/2024 18:56    Scheduled Meds:  apixaban   10 mg Oral BID   Followed by   Cecily Cohen ON 03/06/2024] apixaban   5 mg Oral  BID   aspirin  EC  81 mg Oral Daily   docusate sodium   100 mg Oral BID   insulin  aspart  0-20 Units Subcutaneous TID WC   insulin  aspart  0-5 Units Subcutaneous QHS   insulin  glargine-yfgn  10 Units Subcutaneous Daily   ketorolac   15 mg Intravenous Q6H   polyethylene glycol  17 g Oral BID   traZODone   100 mg Oral QHS   Continuous Infusions:  dextrose  5 % and 0.9 % NaCl 100 mL/hr at 02/27/24 1844     LOS: 2 days  MDM: Patient is high risk for one or more organ failure.  They necessitate ongoing hospitalization for continued IV therapies and subsequent lab monitoring. Total time spent interpreting labs  and vitals, coordinating care amongst consultants and care team members, directly assessing and discussing care with the patient and/or family: 55 min    Takayla Baillie, DO [facility]  To contact the attending physician between 7A-7P please use Epic Chat. To contact the covering physician during after hours 7P-7A, please review Amion.   02/28/2024, 1:37 PM   *This document has been created with the assistance of dictation software. Please excuse typographical errors. *

## 2024-02-28 NOTE — Inpatient Diabetes Management (Signed)
 Inpatient Diabetes Program Recommendations  AACE/ADA: New Consensus Statement on Inpatient Glycemic Control (2015)  Target Ranges:  Prepandial:   less than 140 mg/dL      Peak postprandial:   less than 180 mg/dL (1-2 hours)      Critically ill patien

## 2024-02-28 NOTE — TOC Initial Note (Signed)
 Transition of Care [facility]) - Initial/Assessment Note    Patient Details  Name: Craig Banks MRN: [id number] Date of Birth: [birth year]  Transition of Care [facility]) CM/SW Contact:    Craig Ice, RN Phone Number: 02/28/2024, 12:10 PM  Clinical Narrative:                 Met with patient and son, Craig Banks, at bedside. Explained role of TOC coordinator and purpose of visit. He lives alone, independent with ADLs. He does not have or use any DME. He has not had any home services in the past. He does have a PCP, and has no issues with getting medications. He does have transportation home. TOC will continue to follow patient for discharge needs.   Expected Discharge Plan: Home w Home Health Services Barriers to Discharge: Continued Medical Work up   Patient Goals and CMS Choice Patient states their goals for this hospitalization and ongoing recovery are:: Get better          Expected Discharge Plan and Services       Living arrangements for the past 2 months: Single Family Home                                      Prior Living Arrangements/Services Living arrangements for the past 2 months: Single Family Home Lives with:: Self Patient language and need for interpreter reviewed:: No Do you feel safe going back to the place where you live?: Yes      Need for Family Participation in Patient Care: No (Comment) Care giver support system in place?: Yes (comment)   Criminal Activity/Legal Involvement Pertinent to Current Situation/Hospitalization: No - Comment as needed  Activities of Daily Living   ADL Screening (condition at time of admission) Independently performs ADLs?: No Does the patient have a NEW difficulty with bathing/dressing/toileting/self-feeding that is expected to last >3 days?: Yes (Initiates electronic notice to provider for possible OT consult) Does the patient have a NEW difficulty with getting in/out of bed, walking, or climbing stairs that is  expected to last >3 days?: Yes (Initiates electronic notice to provider for possible PT consult) Does the patient have a NEW difficulty with communication that is expected to last >3 days?: No Is the patient deaf or have difficulty hearing?: No Does the patient have difficulty seeing, even when wearing glasses/contacts?: No Does the patient have difficulty concentrating, remembering, or making decisions?: No  Permission Sought/Granted                  Emotional Assessment Appearance:: Appears stated age Attitude/Demeanor/Rapport: Apprehensive, Engaged Affect (typically observed): Appropriate Orientation: : Oriented to Self, Oriented to Place, Oriented to  Time, Oriented to Situation   Psych Involvement: No (comment)  Admission diagnosis:  Femur fracture, right [facility]) [S72.91XA] Patient Active Problem List   Diagnosis Date Noted   Closed displaced intertrochanteric fracture of right femur (HCC) 02/26/2024   Femur fracture, right (HCC) 02/26/2024   Acalculous cholecystitis 02/17/2015   Insulin  dependent diabetes mellitus 02/17/1995   PCP:  Craig Severe, PA-C Pharmacy:   [facility] - [place], [place] - [address] [place] [address] Phone: [phone] Fax: [phone]  [id number] [place], [place] - [address] AT [facility] [address] [place] [address] Phone: [phone] Fax: [phone]  [facility]  REGIONAL - [facility] [address] [place] [address] Phone: [phone] Fax: [phone]     Social Drivers of Health (SDOH) Social History: SDOH Screenings   Food Insecurity: No Food Insecurity (02/27/2024)  Housing: Low Risk  (02/27/2024)  Transportation Needs: No Transportation Needs (02/27/2024)  Utilities: Not At Risk (02/27/2024)  Tobacco Use: Low Risk  (02/27/2024)   SDOH Interventions: Food Insecurity Interventions: Patient  Declined Utilities Interventions: Patient Declined   Readmission Risk Interventions     No data to display

## 2024-02-28 NOTE — Evaluation (Signed)
 Occupational Therapy Evaluation Patient Details Name: Craig Banks MRN: [id number] DOB: 1966/07/24 Today's Date: 02/28/2024   History of Present Illness   Craig Banks is a 58 y.o. male with medical history significant of IDDM and recent R rotator cuff surgery; presented with mechanical fall and right femur fracture s/p R femur IM Nailing    Clinical Impressions [name] was seen for OT evaluation this date. Prior to hospital admission, pt was IND including working full time. Pt currently requires MAX A don B socks at bed level. MIN A for bed mobility. SETUP + SUPERVISION seated grooming tasks. MOD A + RW sit<>stand, cues to avoid Wbing through RUE as pt is 6 weeks post R rotar cuff surgery. Pt reports 10/10 pain, RN in to administer pain medicine. Pt tense and restless t/o session, increased time to complete minimal AAROM on RLE. Pt would benefit from skilled OT to address noted impairments and functional limitations (see below for any additional details). Upon hospital discharge, recommend OT follow up <3 hours/day.     If plan is discharge home, recommend the following:   A lot of help with walking and/or transfers;A lot of help with bathing/dressing/bathroom     Functional Status Assessment   Patient has had a recent decline in their functional status and demonstrates the ability to make significant improvements in function in a reasonable and predictable amount of time.     Equipment Recommendations   BSC/3in1     Recommendations for Other Services         Precautions/Restrictions   Precautions Precautions: Fall Precaution/Restrictions Comments: RUE 5# lifting restriction and no overhead lifting per CareEverywhere Ortho note Restrictions Weight Bearing Restrictions Per Provider Order: Yes RUE Weight Bearing Per Provider Order: Non weight bearing RLE Weight Bearing Per Provider Order: Weight bearing as tolerated     Mobility Bed Mobility Overal bed  mobility: Needs Assistance Bed Mobility: Supine to Sit     Supine to sit: Min assist     General bed mobility comments: patient resists, doesn't allow a lot of assistance, lots of increased time needed    Transfers Overall transfer level: Needs assistance Equipment used: Rolling walker (2 wheels) Transfers: Sit to/from Stand Sit to Stand: Mod assist, From elevated surface                  Balance Overall balance assessment: Needs assistance Sitting-balance support: Feet supported Sitting balance-Leahy Scale: Good     Standing balance support: Single extremity supported, During functional activity Standing balance-Leahy Scale: Fair                             ADL either performed or assessed with clinical judgement   ADL Overall ADL's : Needs assistance/impaired                                       General ADL Comments: MAX A don B socks at bed level. SETUP + SUPERVISION seated grooming tasks.      Pertinent Vitals/Pain Pain Assessment Pain Assessment: 0-10 Pain Score: 10-Worst pain ever Pain Location: R hip/thigh/buttock Pain Descriptors / Indicators: Discomfort, Grimacing, Guarding, Spasm Pain Intervention(s): Limited activity within patient's tolerance, Patient requesting pain meds-RN notified, RN gave pain meds during session     Extremity/Trunk Assessment Upper Extremity Assessment Upper Extremity Assessment: Overall WFL for tasks assessed  Lower Extremity Assessment Lower Extremity Assessment: Defer to PT evaluation RLE Deficits / Details: very resistent to moving. Wants to direct session. Does not want help moving R LE RLE: Unable to fully assess due to pain RLE Coordination: decreased gross motor   Cervical / Trunk Assessment Cervical / Trunk Assessment: Normal   Communication Communication Communication: No apparent difficulties   Cognition Arousal: Alert Behavior During Therapy: Restless, Anxious Cognition:  No apparent impairments                               Following commands: Intact       Cueing  General Comments   Cueing Techniques: Verbal cues;Gestural cues;Tactile cues              Home Living Family/patient expects to be discharged to:: Private residence Living Arrangements: Alone                           Home Equipment: None          Prior Functioning/Environment Prior Level of Function : Independent/Modified Independent;Working/employed;Driving             Mobility Comments: works at [employer] ADLs Comments: independent    OT Problem List: Decreased strength;Decreased range of motion;Decreased activity tolerance;Impaired balance (sitting and/or standing)   OT Treatment/Interventions: Self-care/ADL training;Therapeutic exercise;Energy conservation;DME and/or AE instruction;Therapeutic activities      OT Goals(Current goals can be found in the care plan section)   Acute Rehab OT Goals Patient Stated Goal: to improve pain OT Goal Formulation: With patient/family Time For Goal Achievement: 03/13/24 Potential to Achieve Goals: Good ADL Goals Pt Will Perform Grooming: with modified independence;standing Pt Will Perform Lower Body Dressing: sit to/from stand;with min assist Pt Will Transfer to Toilet: with modified independence;ambulating;regular height toilet   OT Frequency:  Min 2X/week    Co-evaluation PT/OT/SLP Co-Evaluation/Treatment: Yes Reason for Co-Treatment: For patient/therapist safety;To address functional/ADL transfers PT goals addressed during session: Mobility/safety with mobility OT goals addressed during session: ADL's and self-care      AM-PAC OT 6 Clicks Daily Activity     Outcome Measure Help from another person eating meals?: None Help from another person taking care of personal grooming?: None Help from another person toileting, which includes using toliet, bedpan, or urinal?: A  Lot Help from another person bathing (including washing, rinsing, drying)?: A Lot Help from another person to put on and taking off regular upper body clothing?: None Help from another person to put on and taking off regular lower body clothing?: A Lot 6 Click Score: 18   End of Session Equipment Utilized During Treatment: Rolling walker (2 wheels)  Activity Tolerance: Patient limited by pain Patient left: in bed;with call bell/phone within reach;with family/visitor present  OT Visit Diagnosis: Other abnormalities of gait and mobility (R26.89);Muscle weakness (generalized) (M62.81)                Time: 8692-8593 OT Time Calculation (min): 59 min Charges:  OT General Charges $OT Visit: 1 Visit OT Evaluation $OT Eval Moderate Complexity: 1 Mod OT Treatments $Self Care/Home Management : 23-37 mins  Elston Slot, M.S. OTR/L  02/28/24, 4:04 PM  ascom [phone]

## 2024-02-28 NOTE — Progress Notes (Signed)
 Subjective: 1 Day Post-Op Procedure(s) (LRB): FIXATION, FRACTURE, INTERTROCHANTERIC, WITH INTRAMEDULLARY ROD (Right) Patient reports pain as mild and moderate.   Patient seen in rounds with Dr. Daun Epstein. Patient is well, and has had no acute complaints or problems. Admits to having some nausea. Denies any CP, SOB, vomiting, fevers or chills We will start therapy today. Provider visit during PT visit, patient actively bargaining to move leg due to increased discomfort. Agreeable to attempt to stand Plan is to go Home vs SNF after hospital stay.  Objective: Vital signs in last 24 hours: Temp:  [97.3 F (36.3 C)-98.6 F (37 C)] 97.9 F (36.6 C) (04/18 0953) Pulse Rate:  [41-123] 71 (04/18 0953) Resp:  [12-24] 16 (04/18 0953) BP: (91-147)/(56-75) 91/56 (04/18 0953) SpO2:  [91 %-100 %] 98 % (04/18 0953) Weight:  [77.5 kg] 77.5 kg (04/18 0554)  Intake/Output from previous day:  Intake/Output Summary (Last 24 hours) at 02/28/2024 1335 Last data filed at 02/28/2024 0933 Gross per 24 hour  Intake 2242.44 ml  Output 600 ml  Net 1642.44 ml    Intake/Output this shift: Total I/O In: -  Out: 500 [Urine:500]  Labs: Recent Labs    02/26/24 1636 02/27/24 0942 02/28/24 0434  HGB 13.5 12.3* 9.5*   Recent Labs    02/27/24 0942 02/28/24 0434  WBC 9.8 10.6*  RBC 3.97* 3.05*  HCT 36.1* 27.9*  PLT 217 174   Recent Labs    02/27/24 0942 02/28/24 0434  NA 137 132*  K 4.5 4.6  CL 105 102  CO2 28 23  BUN 20 21*  CREATININE 0.95 0.87  GLUCOSE 145* 308*  CALCIUM  8.4* 7.9*   Recent Labs    02/26/24 1636  INR 1.1    EXAM General - Patient is Alert, Appropriate, and Oriented Extremity - Neurologically intact Neurovascular intact Sensation intact distally Intact pulses distally Dorsiflexion/Plantar flexion intact No cellulitis present Compartment soft Dressing - dressing C/D/I and no drainage Motor Function - intact, moving foot and toes well on exam.  Past Medical  History:  Diagnosis Date   Diabetes (HCC)    Diabetes mellitus without complication (HCC)    age 58   Erectile dysfunction    Proteinuria 2006    Assessment/Plan: 1 Day Post-Op Procedure(s) (LRB): FIXATION, FRACTURE, INTERTROCHANTERIC, WITH INTRAMEDULLARY ROD (Right) Principal Problem:   Femur fracture, right (HCC) Active Problems:   Closed displaced intertrochanteric fracture of right femur (HCC)  Estimated body mass index is 26.75 kg/m as calculated from the following:   Height as of this encounter: 5' 7.01" (1.702 m).   Weight as of this encounter: 77.5 kg. Advance diet Up with therapy Patient will continue to work with physical therapy, WBAT to RLE BP slightly soft, 250 cc bolus. Encouraged oral fluid intake, continue to monitor Labs show hgb at 9.5 and glucose at 308  Patient also found to have a DVT of RLE, believe this to be pre-surgical issue. Will need to continue eliquis  postoperatively Also had recent R rotator cuff repair, limited weight bearing on RUE  DVT prophylaxis: Eliquis  and SCDs Patient will follow-up with [facility] orthopedics in 2 weeks for reevaluation  Wadie Guile, PA-C [facility] 02/28/2024, 1:35 PM

## 2024-02-28 NOTE — Plan of Care (Signed)
   Problem: Education: Goal: Knowledge of General Education information will improve Description Including pain rating scale, medication(s)/side effects and non-pharmacologic comfort measures Outcome: Progressing   Problem: Activity: Goal: Risk for activity intolerance will decrease Outcome: Progressing   Problem: Safety: Goal: Ability to remain free from injury will improve Outcome: Progressing

## 2024-02-28 NOTE — Progress Notes (Addendum)
 Patient notified nurse that glucose level was at 358 according to Libra glucose monitor, stating he ate a cup and a half of fruit and some spaghetti and asked for insulin . Nurse advised patient that insulin  was due at 1700. Patient stated that at home he takes 5 units of short acting and 15 units of long acting before breakfast and 7 units of short acting and 15 units of long acting insulin  before dinner and slides the short acting through out the day if needed. Nurse informed patient that while he is inpatient his orders for insulin  are three times daily with each meal for short acting Novolog  and once a day for long acting insulin  Semglee . Patient asked for adjustment to insulin  according to how he takes it at night. Nurse notified MD for patient's request to change how insulin  is given. MD decreased sliding scale and added on Semglee  to be received twice a day.

## 2024-02-28 NOTE — Evaluation (Signed)
 Physical Therapy Evaluation Patient Details Name: Craig Banks MRN: [id number] DOB: [birth year] Today's Date: 02/28/2024  History of Present Illness  Craig Banks is a 58 y.o. male with medical history significant of IDDM, presented with mechanical fall and right femur fracture.s/p femur IM Nailing. WBAT  Clinical Impression  Patient received in bed, requires extensive time and encouragement to participate in therapy. Initially not wanting to move at all due to pain. Patient eventually was able to perform bed mobility with min A and was able to stand with mod A from elevated bed. He will continue to benefit from skilled PT to improve functional mobility, safety and independence.          If plan is discharge home, recommend the following: A lot of help with walking and/or transfers;A lot of help with bathing/dressing/bathroom;Help with stairs or ramp for entrance   Can travel by private vehicle   No    Equipment Recommendations Rolling walker (2 wheels)  Recommendations for Other Services       Functional Status Assessment Patient has had a recent decline in their functional status and demonstrates the ability to make significant improvements in function in a reasonable and predictable amount of time.     Precautions / Restrictions Precautions Precautions: Fall Restrictions Weight Bearing Restrictions Per Provider Order: Yes RLE Weight Bearing Per Provider Order: Weight bearing as tolerated Other Position/Activity Restrictions: R shoulder recent RTC surgery      Mobility  Bed Mobility Overal bed mobility: Needs Assistance Bed Mobility: Supine to Sit     Supine to sit: Contact guard, Min assist, HOB elevated, Used rails     General bed mobility comments: patient resists, doesn't allow a lot of assistance, lots of increased time needed    Transfers Overall transfer level: Needs assistance Equipment used: Rolling walker (2 wheels) Transfers: Sit to/from  Stand Sit to Stand: Mod assist, From elevated surface           General transfer comment: requires increased time and wants to direct how he will move.    Ambulation/Gait               General Gait Details: unable at this time due to pain  Stairs            Wheelchair Mobility     Tilt Bed    Modified Rankin (Stroke Patients Only)       Balance Overall balance assessment: Needs assistance Sitting-balance support: Feet supported Sitting balance-Leahy Scale: Good     Standing balance support: Bilateral upper extremity supported, During functional activity, Reliant on assistive device for balance Standing balance-Leahy Scale: Good                               Pertinent Vitals/Pain Pain Assessment Pain Assessment: 0-10 Pain Score: 10-Worst pain ever Pain Location: R hip/thigh/buttock Pain Descriptors / Indicators: Discomfort, Grimacing, Guarding, Spasm Pain Intervention(s): Monitored during session, Premedicated before session, Repositioned    Home Living Family/patient expects to be discharged to:: Private residence Living Arrangements: Spouse/significant other               Home Equipment: None      Prior Function Prior Level of Function : Independent/Modified Independent;Working/employed;Driving             Mobility Comments: works at [employer] ADLs Comments: independent     Extremity/Trunk Assessment   Upper Extremity Assessment Upper Extremity Assessment:  Defer to OT evaluation    Lower Extremity Assessment Lower Extremity Assessment: RLE deficits/detail RLE Deficits / Details: very resistent to moving. Wants to direct session. Does not want help moving R LE RLE: Unable to fully assess due to pain RLE Coordination: decreased gross motor    Cervical / Trunk Assessment Cervical / Trunk Assessment: Normal  Communication   Communication Communication: No apparent difficulties    Cognition  Arousal: Alert Behavior During Therapy: Restless, Anxious   PT - Cognitive impairments: No apparent impairments                         Following commands: Intact       Cueing Cueing Techniques: Verbal cues, Gestural cues, Tactile cues     General Comments      Exercises Total Joint Exercises Ankle Circles/Pumps: AROM, Both, 5 reps   Assessment/Plan    PT Assessment Patient needs continued PT services  PT Problem List Decreased range of motion;Decreased activity tolerance;Decreased mobility;Decreased knowledge of use of DME;Pain;Decreased skin integrity       PT Treatment Interventions DME instruction;Gait training;Functional mobility training;Stair training;Therapeutic activities;Therapeutic exercise;Balance training;Neuromuscular re-education;Patient/family education    PT Goals (Current goals can be found in the Care Plan section)  Acute Rehab PT Goals Patient Stated Goal: to improve, decrease pain PT Goal Formulation: With patient Time For Goal Achievement: 03/11/24 Potential to Achieve Goals: Fair    Frequency BID     Co-evaluation PT/OT/SLP Co-Evaluation/Treatment: Yes             AM-PAC PT 6 Clicks Mobility  Outcome Measure Help needed turning from your back to your side while in a flat bed without using bedrails?: A Lot Help needed moving from lying on your back to sitting on the side of a flat bed without using bedrails?: A Lot Help needed moving to and from a bed to a chair (including a wheelchair)?: Total Help needed standing up from a chair using your arms (e.g., wheelchair or bedside chair)?: A Lot Help needed to walk in hospital room?: Total Help needed climbing 3-5 steps with a railing? : Total 6 Click Score: 9    End of Session   Activity Tolerance: Patient limited by pain Patient left: in bed;Other (comment) (left seated on side of bed with son in room) Nurse Communication: Mobility status PT Visit Diagnosis: Other  abnormalities of gait and mobility (R26.89);Pain;History of falling (Z91.81);Difficulty in walking, not elsewhere classified (R26.2) Pain - Right/Left: Right Pain - part of body: Hip    Time: 1307-1406 PT Time Calculation (min) (ACUTE ONLY): 59 min   Charges:   PT Evaluation $PT Eval Moderate Complexity: 1 Mod PT Treatments $Therapeutic Activity: 8-22 mins PT General Charges $$ ACUTE PT VISIT: 1 Visit         Chery Giusto, PT, GCS 02/28/24,2:37 PM

## 2024-02-28 NOTE — Progress Notes (Signed)
 PT Cancellation Note  Patient Details Name: Craig Banks MRN: [id number] DOB: July 01, 1966   Cancelled Treatment:    Reason Eval/Treat Not Completed: Patient declined, no reason specified Patient reports he is not feeling well, declines attempt to get up to recliner. Will re-attempt in about an hour.  Trulee Hamstra 02/28/2024, 9:53 AM

## 2024-02-29 DIAGNOSIS — I82451 Acute embolism and thrombosis of right peroneal vein: Secondary | ICD-10-CM | POA: Diagnosis not present

## 2024-02-29 DIAGNOSIS — S72141A Displaced intertrochanteric fracture of right femur, initial encounter for closed fracture: Secondary | ICD-10-CM | POA: Diagnosis not present

## 2024-02-29 LAB — CBC
HCT: 26.4 % — ABNORMAL LOW (ref 39.0–52.0)
Hemoglobin: 9.1 g/dL — ABNORMAL LOW (ref 13.0–17.0)
MCH: 31.2 pg (ref 26.0–34.0)
MCHC: 34.5 g/dL (ref 30.0–36.0)
MCV: 90.4 fL (ref 80.0–100.0)
Platelets: 163 10*3/uL (ref 150–400)
RBC: 2.92 MIL/uL — ABNORMAL LOW (ref 4.22–5.81)
RDW: 12.9 % (ref 11.5–15.5)
WBC: 8 10*3/uL (ref 4.0–10.5)
nRBC: 0 % (ref 0.0–0.2)

## 2024-02-29 LAB — GLUCOSE, CAPILLARY
Glucose-Capillary: 253 mg/dL — ABNORMAL HIGH (ref 70–99)
Glucose-Capillary: 205 mg/dL — ABNORMAL HIGH (ref 70–99)

## 2024-02-29 NOTE — Progress Notes (Signed)
 Subjective: 2 Days Post-Op Procedure(s) (LRB): FIXATION, FRACTURE, INTERTROCHANTERIC, WITH INTRAMEDULLARY ROD (Right) Patient reports pain as mild and moderate.   Patient seen in rounds with Dr. Daun Epstein. Patient is well, and has had no acute complaints or problems. Admits to having some nausea today while working with PT. Denies any CP, SOB, vomiting, fevers or chills We will continue therapy today. Provider visit during PT visit, patient able to stand with less hesitancy yesterday and perform gentle leg lifts Plan is to go Home vs SNF after hospital stay.  Objective: Vital signs in last 24 hours: Temp:  [97.7 F (36.5 C)-99.4 F (37.4 C)] 99.4 F (37.4 C) (04/19 0331) Pulse Rate:  [98-101] 101 (04/18 1947) Resp:  [14-20] 16 (04/19 0331) BP: (111-129)/(67-70) 129/70 (04/19 0331) SpO2:  [94 %-99 %] 97 % (04/19 0331)  Intake/Output from previous day:  Intake/Output Summary (Last 24 hours) at 02/29/2024 1122 Last data filed at 02/29/2024 0851 Gross per 24 hour  Intake 310.83 ml  Output 300 ml  Net 10.83 ml    Intake/Output this shift: Total I/O In: -  Out: 300 [Urine:300]  Labs: Recent Labs    02/26/24 1636 02/27/24 0942 02/28/24 0434 02/29/24 0449  HGB 13.5 12.3* 9.5* 9.1*   Recent Labs    02/28/24 0434 02/29/24 0449  WBC 10.6* 8.0  RBC 3.05* 2.92*  HCT 27.9* 26.4*  PLT 174 163   Recent Labs    02/27/24 0942 02/28/24 0434  NA 137 132*  K 4.5 4.6  CL 105 102  CO2 28 23  BUN 20 21*  CREATININE 0.95 0.87  GLUCOSE 145* 308*  CALCIUM  8.4* 7.9*   Recent Labs    02/26/24 1636  INR 1.1    EXAM General - Patient is Alert, Appropriate, and Oriented Extremity - Neurologically intact Neurovascular intact Sensation intact distally Intact pulses distally Dorsiflexion/Plantar flexion intact No cellulitis present Compartment soft Dressing - dressing C/D/I and no drainage Motor Function - intact, moving foot and toes well on exam.  Past Medical History:   Diagnosis Date   Diabetes (HCC)    Diabetes mellitus without complication (HCC)    age 58   Erectile dysfunction    Proteinuria 2006    Assessment/Plan: 2 Days Post-Op Procedure(s) (LRB): FIXATION, FRACTURE, INTERTROCHANTERIC, WITH INTRAMEDULLARY ROD (Right) Principal Problem:   Femur fracture, right (HCC) Active Problems:   Closed displaced intertrochanteric fracture of right femur (HCC)  Estimated body mass index is 26.75 kg/m as calculated from the following:   Height as of this encounter: 5' 7.01" (1.702 m).   Weight as of this encounter: 77.5 kg. Advance diet Up with therapy Patient will continue to work with physical therapy, WBAT to RLE BP improved, stable Labs show hgb at 9.1 and most recent glucose at 276  Patient also found to have a DVT of RLE, believe this to be pre-surgical issue. Will need to continue eliquis  postoperatively Also had recent R rotator cuff repair, limited weight bearing on RUE. Trapeze over bar ordered to help with mobility  DVT prophylaxis: Eliquis  and SCDs Patient will follow-up with [facility] clinic orthopedics in 2 weeks for reevaluation  Wadie Guile, PA-C [facility] 02/29/2024, 11:22 AM

## 2024-02-29 NOTE — Progress Notes (Signed)
 Physical Therapy Treatment Patient Details Name: Craig Banks MRN: [id number] DOB: 11-08-1966 Today's Date: 02/29/2024   History of Present Illness Craig Banks is a 58 y.o. male with medical history significant of IDDM and recent R rotator cuff surgery; presented with mechanical fall and right femur fracture s/p R femur IM Nailing    PT Comments  Pt was still sitting in recliner since AM session. Agreeable to returning to bed. Author issued HEP and pt able to perform RLE exercises with assistance. Pain remains most limiting factor throughout session. PT unwilling to perform task in traditional manor and dictates session progression throughout. He is unable to maintain NWB RUE even with max vcs throughout session. Pt did stand pivot back to bed from recliner with LUE pulling bed rail and author protecting BLE from buckling.No buckling observed just blocked knees for safety.  Increased time to perform all task due to pain. Did discuss at length rehab recommendation. Both pt and girlfriend seems open to STR at DC. Acute PT will continue to follow and progress per current POC.    If plan is discharge home, recommend the following: A lot of help with walking and/or transfers;A lot of help with bathing/dressing/bathroom;Help with stairs or ramp for entrance     Equipment Recommendations  Other (comment) (Defer to next level of care)       Precautions / Restrictions Precautions Precautions: Fall Recall of Precautions/Restrictions: Impaired Precaution/Restrictions Comments: NWB RUE, ROM ok but not overhead. WBAT LE Restrictions Weight Bearing Restrictions Per Provider Order: Yes RUE Weight Bearing Per Provider Order: Non weight bearing RLE Weight Bearing Per Provider Order: Weight bearing as tolerated Other Position/Activity Restrictions: R shoulder recent RTC surgery per clarification with  surgeon previous date; NWB RUE, no strengthening     Mobility  Bed Mobility Overal bed  mobility: Needs Assistance Bed Mobility: Sit to Supine  Supine to sit:  (pt wanted to use gait belt + LUE to swing RLE into bed) Sit to supine: Mod assist, Max assist, HOB elevated, Used rails, +2 for safety/equipment General bed mobility comments: pt required extensive time to sit> supine. He was unwilling to attempt traditional way and elected to use gait belt on RLE + using LUE + LLE to assist RLE into bed. ~ 10 minutes to progress form EOB sitting to long sitting in bed    Transfers Overall transfer level: Needs assistance Equipment used:  (pt  used LUE on bedrail with author blocking knees) Transfers: Bed to chair/wheelchair/BSC Sit to Stand: Mod assist, Max assist Step pivot transfers: Min assist  General transfer comment: due to pain, pt unable to take steps this afternoon but ws able to stand pivot towards the L with LUE pulling bed rail and author blocking knees to prevent buckling    Ambulation/Gait Ambulation/Gait assistance: Min assist Gait Distance (Feet): 2 Feet Assistive device: Hemi-walker Gait Pattern/deviations: Step-to pattern, Antalgic Gait velocity: decreased  General Gait Details: unable this afternoon due top pain   Stairs Stairs:  (will most likely need w/c for stair performance)      Balance Overall balance assessment: Needs assistance Sitting-balance support: Feet supported Sitting balance-Leahy Scale: Good     Standing balance support: Single extremity supported, During functional activity, Reliant on assistive device for balance Standing balance-Leahy Scale: Fair Standing balance comment: pt is t risk of falls due to impuslivity current level of pain tolerance       Communication Communication Communication: No apparent difficulties  Cognition Arousal: Alert Behavior During Therapy:  Anxious, WFL for tasks assessed/performed   PT - Cognitive impairments: No apparent impairments      PT - Cognition Comments: Pt is A and O x 4. fixated on pain  but was able to fully partcipate with constant encouragement and redirecting to focus on desired task. unable to perform with maintaining NWB RUE. pt dictates session progression throughout session and does not follow recommendations well Following commands: Intact      Cueing Cueing Techniques: Verbal cues, Tactile cues     General Comments General comments (skin integrity, edema, etc.): discussed importance of bending RLE knee nd performing exercises to promote strengthening      Pertinent Vitals/Pain Pain Assessment Pain Assessment: 0-10 Pain Score: 8  Pain Location: R hip/thigh/buttock Pain Descriptors / Indicators: Discomfort, Grimacing, Guarding, Spasm Pain Intervention(s): Limited activity within patient's tolerance, Monitored during session, Premedicated before session, Repositioned, Ice applied     PT Goals (current goals can now be found in the care plan section) Acute Rehab PT Goals Patient Stated Goal: none truely stated- Author did discuss with pt and girlfriend DC recommendations and how placement works. pt does seem receptive to SNF placement Progress towards PT goals: Progressing toward goals    Frequency    BID           Co-evaluation     PT goals addressed during session: Mobility/safety with mobility        AM-PAC PT "6 Clicks" Mobility   Outcome Measure  Help needed turning from your back to your side while in a flat bed without using bedrails?: A Little Help needed moving from lying on your back to sitting on the side of a flat bed without using bedrails?: A Lot Help needed moving to and from a bed to a chair (including a wheelchair)?: A Lot Help needed standing up from a chair using your arms (e.g., wheelchair or bedside chair)?: A Lot Help needed to walk in hospital room?: A Lot Help needed climbing 3-5 steps with a railing? : Total 6 Click Score: 12    End of Session Equipment Utilized During Treatment: Gait belt Activity Tolerance:  Patient limited by pain Patient left: in bed;with call bell/phone within reach;with family/visitor present;with bed alarm set Nurse Communication: Mobility status PT Visit Diagnosis: Other abnormalities of gait and mobility (R26.89);Pain;History of falling (Z91.81);Difficulty in walking, not elsewhere classified (R26.2) Pain - Right/Left: Right Pain - part of body: Hip     Time: 8295-6213 PT Time Calculation (min) (ACUTE ONLY): 43 min  Charges:    $Therapeutic Activity: 38-52 mins PT General Charges $$ ACUTE PT VISIT: 1 Visit                     Chester Costa PTA 02/29/24, 5:11 PM

## 2024-02-29 NOTE — Progress Notes (Signed)
 PROGRESS NOTE    Craig Banks  ZOX:[id number] DOB: 01/03/1966 DOA: 02/26/2024 PCP: Marcina Severe, PA-C  Chief Complaint  Patient presents with   [facility] Course:  Craig Banks is 58 y.o. male with insulin -dependent diabetes, who presented with mechanical fall while trying to catch a dog and subsequently sustained a right femur fracture.  Fracture confirmed by x-ray in the ED.  Labs and vitals mostly unremarkable except for hyperglycemia 253.  Patient received Dilaudid  and orthopedic surgery was consulted.  CT scan was ordered but patient reported he was unable to tolerate  Subjective: No acute events overnight.  Patient more ambulatory with physical therapy though remains very hesitant.  He is still resistant to discussing discharge plans.  He reports " we will see."  He is not adverse to SNF/rehab.   Objective: Vitals:   02/28/24 0953 02/28/24 1513 02/28/24 1947 02/29/24 0331  BP: (!) 91/56 111/70 111/67 129/70  Pulse: 71 98 (!) 101   Resp: 16 14 20 16   Temp: 97.9 F (36.6 C) 97.7 F (36.5 C) 98 F (36.7 C) 99.4 F (37.4 C)  TempSrc:   Oral Oral  SpO2: 98% 99% 94% 97%  Weight:      Height:        Intake/Output Summary (Last 24 hours) at 02/29/2024 1445 Last data filed at 02/29/2024 0851 Gross per 24 hour  Intake 310.83 ml  Output 300 ml  Net 10.83 ml   Filed Weights   02/28/24 0554  Weight: 77.5 kg    Examination: General exam: Appears calm and comfortable, NAD  Respiratory system: No work of breathing, symmetric chest wall expansion Cardiovascular system: S1 & S2 heard, RRR.  Gastrointestinal system: Abdomen is nondistended, soft and nontender.  Neuro: Alert and oriented. No focal neurological deficits. Extremities: Right leg with postoperative bandages, swelling improving.   Psychiatry: Demonstrates appropriate judgement and insight. Mood & affect appropriate for situation.   Assessment & Plan:  Principal Problem:   Femur fracture,  right (HCC) Active Problems:   Closed displaced intertrochanteric fracture of right femur (HCC)    Right proximal femur intertrochanteric  fracture - 4/17 reduction and internal fixation of displaced four-part intertrochanteric right hip fracture. - No concerns of pathologic fracture per orthopedic surgery - Continue PT/OT.  Have encouraged patient on early mobilization and weightbearing on left leg, encourage pivoting and independent transfers. - PT is somewhat limited as he has a right rotator cuff repair from 12/30/2023, and has limited lifting and weightbearing on RUE. - At minimum patient will need outpatient physical therapy, he is resistant to discussing discharge.  Will consult TOC for SNF/rehab options which patient is amendable to. - Continue with scheduled Toradol  and as needed Oxy and Tylenol  for pain control.  Patient continues to request medication around-the-clock.  Will continue to titrate  DVT right lower extremity - Doppler confirms acute DVT in right cheerier tibial and peroneal vein.  This was first observed by anesthesiologist in the preoperative period.  I do suspect that it was present on arrival. - Currently on loading dose of Eliquis , will continue.  Will need Eliquis  for at least 3 months   Right ankle swelling - X-ray without acute fracture.  Does note effusion and heterotopic ossification, suspected from old injury  Uncontrolled Type 1 diabetes with hyperglycemia - Hemoglobin A1c 8.2% - Patient has CGM and prefers to follow his own sliding scale which is lower insulin  doses than ours.  We will continue to adjust  accordingly.  Patient's blood glucoses have trended higher while here though he has been resistant to higher aspart - Will resume his home dose Lantus  twice daily. - Patient reports he has recently been eating poorly contributing to his higher A1c.  Reports he has previously been closer to 7%  Anxiety Depression - Resume home meds  DVT prophylaxis:  Eliquis  Disposition: Inpatient, medically patient is on p.o. medications and can likely be discharged.  Have consulted TOC for rehab planning as patient is resistant to returning home at this time.  Continue physical therapy while admitted Consultants:  Treatment Team:  Consulting Physician: Venus Ginsberg, MD  Procedures:    Antimicrobials:  Anti-infectives (From admission, onward)    Start     Dose/Rate Route Frequency Ordered Stop   02/27/24 1800  ceFAZolin  (ANCEF ) IVPB 2g/100 mL premix        2 g 200 mL/hr over 30 Minutes Intravenous Every 6 hours 02/27/24 1553 02/28/24 1425   02/27/24 1229  ceFAZolin  (ANCEF ) IVPB 2g/100 mL premix        2 g 200 mL/hr over 30 Minutes Intravenous 30 min pre-op 02/27/24 1229 02/27/24 1318       Data Reviewed: I have personally reviewed following labs and imaging studies CBC: Recent Labs  Lab 02/26/24 1636 02/27/24 0942 02/28/24 0434 02/29/24 0449  WBC 9.6 9.8 10.6* 8.0  NEUTROABS 7.2 6.2  --   --   HGB 13.5 12.3* 9.5* 9.1*  HCT 40.3 36.1* 27.9* 26.4*  MCV 90.6 90.9 91.5 90.4  PLT 269 217 174 163   Basic Metabolic Panel: Recent Labs  Lab 02/26/24 1636 02/27/24 0942 02/28/24 0434  NA 137 137 132*  K 4.9 4.5 4.6  CL 105 105 102  CO2 25 28 23   GLUCOSE 253* 145* 308*  BUN 19 20 21*  CREATININE 1.01 0.95 0.87  CALCIUM  8.7* 8.4* 7.9*   GFR: Estimated Creatinine Clearance: 87.6 mL/min (by C-G formula based on SCr of 0.87 mg/dL). Liver Function Tests: Recent Labs  Lab 02/27/24 0942  AST 34  ALT 25  ALKPHOS 51  BILITOT 0.9  PROT 5.4*  ALBUMIN  3.1*   CBG: Recent Labs  Lab 02/28/24 1017 02/28/24 1143 02/28/24 1650 02/28/24 2025 02/29/24 1200  GLUCAP 291* 271* 326* 276* 253*    Recent Results (from the past 240 hours)  Surgical PCR screen     Status: None   Collection Time: 02/27/24  4:04 AM   Specimen: Nasal Mucosa; Nasal Swab  Result Value Ref Range Status   MRSA, PCR NEGATIVE NEGATIVE Final   Staphylococcus  aureus NEGATIVE NEGATIVE Final    Comment: (NOTE) The Xpert SA Assay (FDA approved for NASAL specimens in patients 65 years of age and older), is one component of a comprehensive surveillance program. It is not intended to diagnose infection nor to guide or monitor treatment. Performed at [facility], [address], [place], [place] [address]      Radiology Studies: DG Ankle 2 Views Right Result Date: 02/27/2024 CLINICAL DATA:  9965 Edema 782.3.  ICD-9-CM EXAM: RIGHT ANKLE - 2 VIEW COMPARISON:  None Available. FINDINGS: No acute fracture. There is remodeling of the talar dome which may relate to avascular necrosis or advanced degenerative change with flattening of the dome and moderate tibiotalar degenerative arthritis. There is subchondral sclerosis and asymmetric joint space narrowing involving the subtalar facets in keeping with moderate degenerative arthritis involving these articulations, not well profiled on this examination. There is heterotopic ossification subjacent to  the medial malleolus and an ossific excrescence subjacent to the lateral malleolus of unclear significance, possibly posttraumatic in nature. Right ankle effusion is present. Moderate bimalleolar soft tissue swelling noted, more severe laterally. IMPRESSION: 1. Moderate bimalleolar soft tissue swelling, more severe laterally. 2. Moderate tibiotalar and subtalar degenerative arthritis, not well profiled on this examination. 3. Heterotopic ossification involving the hindfoot, not optimally profiled on this examination, possibly posttraumatic in nature. If indicated, this would be better delineated with CT imaging. 4. Right ankle effusion. Electronically Signed   By: Worthy Heads M.D.   On: 02/27/2024 19:49   US  Venous Img Lower Unilateral Right (DVT) Result Date: 02/27/2024 CLINICAL DATA:  Edema for 2 hours EXAM: RIGHT LOWER EXTREMITY VENOUS DOPPLER ULTRASOUND TECHNIQUE: Gray-scale sonography with graded  compression, as well as color Doppler and duplex ultrasound were performed to evaluate the lower extremity deep venous systems from the level of the common femoral vein and including the common femoral, femoral, profunda femoral, popliteal and calf veins including the posterior tibial, peroneal and gastrocnemius veins when visible. The superficial great saphenous vein was also interrogated. Spectral Doppler was utilized to evaluate flow at rest and with distal augmentation maneuvers in the common femoral, femoral and popliteal veins. COMPARISON:  None available FINDINGS: Contralateral Common Femoral Vein: Respiratory phasicity is normal and symmetric with the symptomatic side. No evidence of thrombus. Normal compressibility. Common Femoral Vein: No evidence of thrombus. Normal compressibility, respiratory phasicity and response to augmentation. Saphenofemoral Junction: No evidence of thrombus. Normal compressibility and flow on color Doppler imaging. Profunda Femoral Vein: No evidence of thrombus. Normal compressibility and flow on color Doppler imaging. Femoral Vein: No evidence of thrombus. Normal compressibility, respiratory phasicity and response to augmentation. Popliteal Vein: No evidence of thrombus. Normal compressibility, respiratory phasicity and response to augmentation. Calf Veins: Intraluminal filling defect with decreased compressibility and flow of the right posterior tibial and peroneal veins consistent with acute DVT. Superficial Great Saphenous Vein: No evidence of thrombus. Normal compressibility. Venous Reflux:  None. Other Findings:  None. IMPRESSION: Acute DVT of the right posterior tibial and peroneal veins. Electronically Signed   By: Elester Grim M.D.   On: 02/27/2024 17:31    Scheduled Meds:  apixaban   10 mg Oral BID   Followed by   Cecily Cohen ON 03/06/2024] apixaban   5 mg Oral BID   aspirin  EC  81 mg Oral Daily   docusate sodium   100 mg Oral BID   insulin  aspart  0-15 Units  Subcutaneous TID WC   insulin  aspart  0-5 Units Subcutaneous QHS   insulin  glargine-yfgn  15 Units Subcutaneous BID   polyethylene glycol  17 g Oral BID   traZODone   100 mg Oral QHS   Continuous Infusions:     LOS: 3 days  MDM: Patient is high risk for one or more organ failure.  They necessitate ongoing hospitalization for continued IV therapies and subsequent lab monitoring. Total time spent interpreting labs and vitals, coordinating care amongst consultants and care team members, directly assessing and discussing care with the patient and/or family: 55 min    Zariah Jost, DO [facility]  To contact the attending physician between 7A-7P please use Epic Chat. To contact the covering physician during after hours 7P-7A, please review Amion.   02/29/2024, 2:45 PM   *This document has been created with the assistance of dictation software. Please excuse typographical errors. *

## 2024-02-29 NOTE — Plan of Care (Signed)
  Problem: Education: Goal: Ability to describe self-care measures that may prevent or decrease complications (Diabetes Survival Skills Education) will improve Outcome: Progressing   Problem: Nutritional: Goal: Maintenance of adequate nutrition will improve Outcome: Progressing   Problem: Clinical Measurements: Goal: Ability to maintain clinical measurements within normal limits will improve Outcome: Progressing   Problem: Activity: Goal: Risk for activity intolerance will decrease Outcome: Progressing   Problem: Pain Managment: Goal: General experience of comfort will improve and/or be controlled Outcome: Progressing   Problem: Elimination: Goal: Will not experience complications related to bowel motility Outcome: Progressing Goal: Will not experience complications related to urinary retention Outcome: Progressing   Problem: Safety: Goal: Ability to remain free from injury will improve Outcome: Progressing   Problem: Skin Integrity: Goal: Risk for impaired skin integrity will decrease Outcome: Progressing

## 2024-02-29 NOTE — Progress Notes (Signed)
 Physical Therapy Treatment Patient Details Name: Craig Banks MRN: [id number] DOB: [birth year] Today's Date: 02/29/2024   History of Present Illness Craig Banks is a 58 y.o. male with medical history significant of IDDM and recent R rotator cuff surgery; presented with mechanical fall and right femur fracture s/p R femur IM Nailing    PT Comments  Pt was long sitting in bed; pre-medicated, with hospitalist in room, upon arrival. He is agreeable to session with encouragement. Pt remains very pain limited but overall was cooperative + impulsive. Pt had recent R shoulder surgery and is to remain NWB RUE. Pt unable to adhere throughout session with constant vcs for maintaining NWB RUE. Pt likes to dictate session progression however did perform better today versus previous date. He was unwilling to stand to hemiwalker at first but was willing to stand to standard RW. Pt performed STS 4 x EOB. Each trial of standing progressed with lifting RLE then eventually to lifting LLE. By the end of session, pt did stand and take steps from EOB to recliner with use of hemiwalker. He remains pain limited but does endorse feeling better with getting up then he did when in bed. Author will return this afternoon for BID session as requested.    If plan is discharge home, recommend the following: A lot of help with walking and/or transfers;A lot of help with bathing/dressing/bathroom;Help with stairs or ramp for entrance     Equipment Recommendations  Other (comment) (Ongoing assessment)       Precautions / Restrictions Precautions Precautions: Fall Restrictions Weight Bearing Restrictions Per Provider Order: Yes RUE Weight Bearing Per Provider Order: Non weight bearing RLE Weight Bearing Per Provider Order: Weight bearing as tolerated Other Position/Activity Restrictions: R shoulder recent RTC surgery per clarification with  surgeon previous date; NWB RUE, no strengthening     Mobility  Bed  Mobility Overal bed mobility: Needs Assistance Bed Mobility: Supine to Sit  Supine to sit: Min assist, Used rails General bed mobility comments: pt requires increased time to exit bed due to pain, author demonstrated different technqiues to assist RLE top EOB>floor. constant vcs for adhering to NWB RUE    Transfers Overall transfer level: Needs assistance Equipment used: Rolling walker (2 wheels), Hemi-walker Transfers: Sit to/from Stand, Bed to chair/wheelchair/BSC Sit to Stand: Min assist, From elevated surface Step pivot transfers: Min assist  General transfer comment: pt stood 4 x EOB to RW with LUE support and Max vcs for not using RUE. pt originally unwilling to use hemiwalker and only willing to use standard RW. eventully agreeble to [employer] and was able to stand and take several steps to recliner    Ambulation/Gait Ambulation/Gait assistance: Editor, commissioning (Feet): 2 Feet Assistive device: Hemi-walker Gait Pattern/deviations: Step-to pattern, Antalgic Gait velocity: decreased  General Gait Details: Pt was able to take a couple steps from EOB to recliner   Stairs Stairs:  (will most likely need w/c for stair performance)    Balance Overall balance assessment: Needs assistance Sitting-balance support: Feet supported Sitting balance-Leahy Scale: Good     Standing balance support: Single extremity supported, During functional activity, Reliant on assistive device for balance Standing balance-Leahy Scale: Fair Standing balance comment: pt is t risk of falls due to impuslivity current level of pain tolerance       Communication Communication Communication: No apparent difficulties  Cognition Arousal: Alert Behavior During Therapy: Anxious   PT - Cognitive impairments: No apparent impairments   PT - Cognition Comments:  Pt is A and O x 4. fixated on pain but was abl to fully partcipate wiht constant encouragement and redirecting to focus on desired  task Following commands: Intact      Cueing Cueing Techniques: Verbal cues, Tactile cues     General Comments General comments (skin integrity, edema, etc.): discussed importance of bending RLE knee nd performing exercises to promote strengthening      Pertinent Vitals/Pain Pain Assessment Pain Assessment: 0-10 Pain Score: 9  Pain Location: R hip/thigh/buttock Pain Descriptors / Indicators: Discomfort, Grimacing, Guarding, Spasm Pain Intervention(s): Limited activity within patient's tolerance, Monitored during session, Premedicated before session, Repositioned, Ice applied     PT Goals (current goals can now be found in the care plan section) Acute Rehab PT Goals Patient Stated Goal: none truely stated Progress towards PT goals: Progressing toward goals    Frequency    BID           Co-evaluation     PT goals addressed during session: Mobility/safety with mobility        AM-PAC PT "6 Clicks" Mobility   Outcome Measure  Help needed turning from your back to your side while in a flat bed without using bedrails?: A Little Help needed moving from lying on your back to sitting on the side of a flat bed without using bedrails?: A Lot Help needed moving to and from a bed to a chair (including a wheelchair)?: A Lot Help needed standing up from a chair using your arms (e.g., wheelchair or bedside chair)?: A Lot Help needed to walk in hospital room?: A Lot Help needed climbing 3-5 steps with a railing? : Total 6 Click Score: 12    End of Session Equipment Utilized During Treatment: Gait belt Activity Tolerance: Patient limited by pain Patient left: in chair;with call bell/phone within reach;with chair alarm set Nurse Communication: Mobility status PT Visit Diagnosis: Other abnormalities of gait and mobility (R26.89);Pain;History of falling (Z91.81);Difficulty in walking, not elsewhere classified (R26.2) Pain - Right/Left: Right Pain - part of body: Hip     Time:  9604-5409 PT Time Calculation (min) (ACUTE ONLY): 38 min  Charges:    $Therapeutic Activity: 38-52 mins PT General Charges $$ ACUTE PT VISIT: 1 Visit                    Chester Costa PTA 02/29/24, 1:52 PM

## 2024-02-29 NOTE — Plan of Care (Signed)
  Problem: Fluid Volume: Goal: Ability to maintain a balanced intake and output will improve Outcome: Progressing   Problem: Metabolic: Goal: Ability to maintain appropriate glucose levels will improve Outcome: Progressing   Problem: Nutritional: Goal: Maintenance of adequate nutrition will improve Outcome: Progressing   Problem: Skin Integrity: Goal: Risk for impaired skin integrity will decrease Outcome: Progressing   Problem: Clinical Measurements: Goal: Ability to maintain clinical measurements within normal limits will improve Outcome: Progressing   Problem: Activity: Goal: Risk for activity intolerance will decrease Outcome: Progressing   Problem: Pain Managment: Goal: General experience of comfort will improve and/or be controlled Outcome: Progressing

## 2024-02-29 NOTE — Progress Notes (Signed)
   02/29/24 2121  Assess: MEWS Score  Temp 99 F (37.2 C)  BP (!) 131/115  MAP (mmHg) 121  Pulse Rate (!) 116  Resp 18  Level of Consciousness Alert  SpO2 97 %  O2 Device Room Air  Patient Activity (if Appropriate) In bed  Assess: MEWS Score  MEWS Temp 0  MEWS Systolic 0  MEWS Pulse 2  MEWS RR 0  MEWS LOC 0  MEWS Score 2  MEWS Score Color Yellow  Assess: if the MEWS score is Yellow or Red  Were vital signs accurate and taken at a resting state? Yes  Does the patient meet 2 or more of the SIRS criteria? No  Does the patient have a confirmed or suspected source of infection? No  MEWS guidelines implemented  Yes, yellow (prn pain meds given per request)  Treat  MEWS Interventions Considered administering scheduled or prn medications/treatments as ordered  Take Vital Signs  Increase Vital Sign Frequency  Yellow: Q2hr x1, continue Q4hrs until patient remains green for 12hrs  Escalate  MEWS: Escalate Yellow: Discuss with charge nurse and consider notifying provider and/or RRT  Notify: Charge Nurse/RN  Name of Charge Nurse/RN Notified Research scientist (life sciences)  Assess: SIRS CRITERIA  SIRS Temperature  0  SIRS Respirations  0  SIRS Pulse 1  SIRS WBC 0  SIRS Score Sum  1

## 2024-03-01 DIAGNOSIS — I82451 Acute embolism and thrombosis of right peroneal vein: Secondary | ICD-10-CM | POA: Diagnosis not present

## 2024-03-01 DIAGNOSIS — S72141A Displaced intertrochanteric fracture of right femur, initial encounter for closed fracture: Secondary | ICD-10-CM | POA: Diagnosis not present

## 2024-03-01 LAB — CBC WITH DIFFERENTIAL/PLATELET
Abs Immature Granulocytes: 0.04 10*3/uL (ref 0.00–0.07)
Basophils Absolute: 0 10*3/uL (ref 0.0–0.1)
Basophils Relative: 0 %
Eosinophils Absolute: 0.5 10*3/uL (ref 0.0–0.5)
Eosinophils Relative: 5 %
HCT: 26.2 % — ABNORMAL LOW (ref 39.0–52.0)
Hemoglobin: 8.9 g/dL — ABNORMAL LOW (ref 13.0–17.0)
Immature Granulocytes: 1 %
Lymphocytes Relative: 13 %
Lymphs Abs: 1.1 10*3/uL (ref 0.7–4.0)
MCH: 29.9 pg (ref 26.0–34.0)
MCHC: 34 g/dL (ref 30.0–36.0)
MCV: 87.9 fL (ref 80.0–100.0)
Monocytes Absolute: 1.2 10*3/uL — ABNORMAL HIGH (ref 0.1–1.0)
Monocytes Relative: 14 %
Neutro Abs: 6 10*3/uL (ref 1.7–7.7)
Neutrophils Relative %: 67 %
Platelets: 196 10*3/uL (ref 150–400)
RBC: 2.98 MIL/uL — ABNORMAL LOW (ref 4.22–5.81)
RDW: 12.8 % (ref 11.5–15.5)
WBC: 8.8 10*3/uL (ref 4.0–10.5)
nRBC: 0 % (ref 0.0–0.2)

## 2024-03-01 LAB — COMPREHENSIVE METABOLIC PANEL WITH GFR
ALT: 39 U/L (ref 0–44)
AST: 42 U/L — ABNORMAL HIGH (ref 15–41)
Albumin: 2.5 g/dL — ABNORMAL LOW (ref 3.5–5.0)
Alkaline Phosphatase: 97 U/L (ref 38–126)
Anion gap: 7 (ref 5–15)
BUN: 15 mg/dL (ref 6–20)
CO2: 26 mmol/L (ref 22–32)
Calcium: 8 mg/dL — ABNORMAL LOW (ref 8.9–10.3)
Chloride: 99 mmol/L (ref 98–111)
Creatinine, Ser: 0.88 mg/dL (ref 0.61–1.24)
GFR, Estimated: >60 mL/min (ref >60)
Glucose, Bld: 196 mg/dL — ABNORMAL HIGH (ref 70–99)
Potassium: 4.1 mmol/L (ref 3.5–5.1)
Sodium: 132 mmol/L — ABNORMAL LOW (ref 135–145)
Total Bilirubin: 1.4 mg/dL — ABNORMAL HIGH (ref 0.0–1.2)
Total Protein: 5.4 g/dL — ABNORMAL LOW (ref 6.5–8.1)

## 2024-03-01 LAB — GLUCOSE, CAPILLARY
Glucose-Capillary: 174 mg/dL — ABNORMAL HIGH (ref 70–99)
Glucose-Capillary: 229 mg/dL — ABNORMAL HIGH (ref 70–99)
Glucose-Capillary: 160 mg/dL — ABNORMAL HIGH (ref 70–99)

## 2024-03-01 MED ORDER — INSULIN ASPART 100 UNIT/ML IJ SOLN
0.0000 [IU] | Freq: Three times a day (TID) | INTRAMUSCULAR | Status: DC
  Administered 2024-03-01: 4 [IU] via SUBCUTANEOUS
  Administered 2024-03-01 – 2024-03-02 (×3): 11 [IU] via SUBCUTANEOUS
  Administered 2024-03-02: 4 [IU] via SUBCUTANEOUS
  Administered 2024-03-03: 11 [IU] via SUBCUTANEOUS
  Administered 2024-03-03: 5 [IU] via SUBCUTANEOUS
  Administered 2024-03-03: 7 [IU] via SUBCUTANEOUS
  Administered 2024-03-04: 4 [IU] via SUBCUTANEOUS
  Filled 2024-03-01 (×9): qty 1

## 2024-03-01 MED ORDER — INSULIN GLARGINE-YFGN 100 UNIT/ML ~~LOC~~ SOLN
15.0000 [IU] | Freq: Two times a day (BID) | SUBCUTANEOUS | Status: DC
  Administered 2024-03-01 – 2024-03-06 (×10): 15 [IU] via SUBCUTANEOUS
  Filled 2024-03-01 (×11): qty 0.15

## 2024-03-01 MED ORDER — PHENOL 1.4 % MT LIQD
1.0000 | OROMUCOSAL | Status: DC | PRN
  Administered 2024-03-01: 1 via OROMUCOSAL
  Filled 2024-03-01: qty 177

## 2024-03-01 MED ORDER — KETOROLAC TROMETHAMINE 30 MG/ML IJ SOLN
30.0000 mg | Freq: Four times a day (QID) | INTRAMUSCULAR | Status: DC
  Administered 2024-03-01 – 2024-03-05 (×17): 30 mg via INTRAVENOUS
  Filled 2024-03-01 (×17): qty 1

## 2024-03-01 NOTE — Plan of Care (Signed)

## 2024-03-01 NOTE — TOC Progression Note (Signed)
 Transition of Care [facility]) - Progression Note    Patient Details  Name: Craig Banks MRN: [id number] Date of Birth: 02-Apr-1966  [facility]) CM/SW Contact  Areta Beer, RN Phone Number: 03/01/2024, 2:53 PM  Clinical Narrative:  4/25: RN CM spoke with patient regarding STR/SNF choices. Patient stated he knows how to look them up and will do that this evening but does not feel ready to go and still hopes to get well enough to do home with home health. Explained RN CM or  CSW will reach out again Monday.    Katheryn Pandy MSN RN CM  RN Case Manager [place]  Transitions of Care Direct Dial: [phone] (Weekends Only) [facility] Main Office Phone: [phone] [facility] Fax: [phone] [email or web address]      Expected Discharge Plan: Home w Home Health Services Barriers to Discharge: Continued Medical Work up  Expected Discharge Plan and Services       Living arrangements for the past 2 months: Single Family Home                                       Social Determinants of Health (SDOH) Interventions SDOH Screenings   Food Insecurity: No Food Insecurity (02/27/2024)  Housing: Low Risk  (02/27/2024)  Transportation Needs: No Transportation Needs (02/27/2024)  Utilities: Not At Risk (02/27/2024)  Tobacco Use: Low Risk  (02/27/2024)    Readmission Risk Interventions     No data to display

## 2024-03-01 NOTE — Progress Notes (Signed)
 Physical Therapy Treatment Patient Details Name: Craig Banks MRN: [id number] DOB: 12-09-1965 Today's Date: 03/01/2024   History of Present Illness Craig Banks is a 58 y.o. male with medical history significant of IDDM and recent R rotator cuff surgery; presented with mechanical fall and right femur fracture s/p R femur IM Nailing (WBAT R LE)    PT Comments  Session coordinated with PM pain medication schedule (received prior to session).  Continues to endorse persistent pain to R LE, but feels it gradually improving and edema gradually resolving. Able to complete bed mobility, sit/stand and standing balance with less physical assist this date; does require use of RW and near-constant cuing for NWB/balance only to R UE with movement transitions. Tolerating increased open-chain, AROM to R hip and knee this date; voices feeling "better" with movement and sitting edge of bed. Does endorse onset of nausea with standing and mobility efforts in session (RN in to administer zofran ); declined further OOB efforts as result.  Will continue efforts-emphasis on OOB to chair and stepping/gait-next session as appropriate.     If plan is discharge home, recommend the following: A lot of help with walking and/or transfers;A lot of help with bathing/dressing/bathroom;Help with stairs or ramp for entrance   Can travel by private vehicle     No  Equipment Recommendations       Recommendations for Other Services       Precautions / Restrictions Precautions Precautions: Fall Recall of Precautions/Restrictions: Impaired Precaution/Restrictions Comments: NWB RUE, ROM ok but not overhead. WBAT R LE Restrictions Weight Bearing Restrictions Per Provider Order: Yes RUE Weight Bearing Per Provider Order: Non weight bearing RLE Weight Bearing Per Provider Order: Weight bearing as tolerated Other Position/Activity Restrictions: R shoulder recent RTC surgery per clarification with  surgeon  previous date; NWB RUE, no strengthening     Mobility  Bed Mobility Overal bed mobility: Needs Assistance Bed Mobility: Supine to Sit     Supine to sit: Min assist     General bed mobility comments: requested transition towards L side of bed, using cloth gait belt as leg-lifter for R LE.  Alternates between using leg-lifter and L LE 'hook technique' to progress R LE towards edge of bed, ultimately requiring min assist from therapist for full movement/guarding of extremity once off edge of bed.    Transfers Overall transfer level: Needs assistance Equipment used: Rolling walker (2 wheels)   Sit to Stand: Min assist, Contact guard assist           General transfer comment: patient insistent on raised bed surface for initial rep; progressively lowered with subsequent reps until at normal height    Ambulation/Gait               General Gait Details: deferred due to nausea with mobility efforts   Stairs             Wheelchair Mobility     Tilt Bed    Modified Rankin (Stroke Patients Only)       Balance Overall balance assessment: Needs assistance Sitting-balance support: No upper extremity supported, Feet supported Sitting balance-Leahy Scale: Good     Standing balance support: Bilateral upper extremity supported Standing balance-Leahy Scale: Fair Standing balance comment: consistent cuing for balance/steadying only to R UE on RW                            Communication Communication Communication: No apparent difficulties  Cognition  Arousal: Alert Behavior During Therapy: Anxious, WFL for tasks assessed/performed   PT - Cognitive impairments: No apparent impairments                       PT - Cognition Comments: Pleasant and agreeable to session.  Appreciates insight and communication into plan, but likes to 'problem-solve' his way.  Verbalizes instruction for NWB R UE; significant difficulty maintaining Following commands:  Intact      Cueing Cueing Techniques: Verbal cues, Tactile cues  Exercises Other Exercises Other Exercises: Standing therex with RW, cga/min assist: lateral weight shifting to promote increased loading to R LE; R LE flex/ext/abduct/adduct/circumduction in open-chain position (min facilitation to stabilize trunk and isolate movement to R hip) Other Exercises: Sitting R LE therex: LAQs with heel slides on floor to increase R knee flexion (tolerating to approx 95 degrees); requires bilat UE support to offload weight in R foot before able to mobilize    General Comments        Pertinent Vitals/Pain Pain Assessment Pain Assessment: 0-10 Pain Score: 6  Pain Location: R hip/thigh/buttock Pain Descriptors / Indicators: Discomfort, Grimacing, Guarding, Spasm Pain Intervention(s): Limited activity within patient's tolerance, Monitored during session, Premedicated before session, Repositioned    Home Living                          Prior Function            PT Goals (current goals can now be found in the care plan section) Acute Rehab PT Goals Patient Stated Goal: none truely stated- Author did discuss with pt and girlfriend DC recommendations and how placement works. pt does seem receptive to SNF placement PT Goal Formulation: With patient Time For Goal Achievement: 03/11/24 Potential to Achieve Goals: Fair Progress towards PT goals: Progressing toward goals    Frequency    BID      PT Plan      Co-evaluation              AM-PAC PT "6 Clicks" Mobility   Outcome Measure  Help needed turning from your back to your side while in a flat bed without using bedrails?: A Little Help needed moving from lying on your back to sitting on the side of a flat bed without using bedrails?: A Little Help needed moving to and from a bed to a chair (including a wheelchair)?: A Lot Help needed standing up from a chair using your arms (e.g., wheelchair or bedside chair)?: A  Lot Help needed to walk in hospital room?: A Lot Help needed climbing 3-5 steps with a railing? : Total 6 Click Score: 13    End of Session Equipment Utilized During Treatment: Gait belt Activity Tolerance: Patient limited by pain Patient left: in bed;with call bell/phone within reach;with family/visitor present;with bed alarm set (seated edge of bed per patient request; RN informed/aware) Nurse Communication: Mobility status PT Visit Diagnosis: Other abnormalities of gait and mobility (R26.89);Pain;History of falling (Z91.81);Difficulty in walking, not elsewhere classified (R26.2) Pain - Right/Left: Right Pain - part of body: Hip     Time: 1610-9604 PT Time Calculation (min) (ACUTE ONLY): 37 min  Charges:    $Therapeutic Exercise: 8-22 mins $Therapeutic Activity: 8-22 mins PT General Charges $$ ACUTE PT VISIT: 1 Visit                     Craig Banks, PT, DPT, NCS 03/01/24, 5:06  PM [phone]

## 2024-03-01 NOTE — Progress Notes (Signed)
 PROGRESS NOTE    Craig Banks  ONG:[id number] DOB: [birth year] DOA: 02/26/2024 PCP: Marcina Severe, PA-C  Chief Complaint  Patient presents with   [facility] Course:  Craig Banks is 58 y.o. male with insulin -dependent diabetes, who presented with mechanical fall while trying to catch a dog and subsequently sustained a right femur fracture.  Fracture confirmed by x-ray in the ED.  Labs and vitals mostly unremarkable except for hyperglycemia 253.  Patient received Dilaudid  and orthopedic surgery was consulted.  CT scan was ordered but patient reported he was unable to tolerate  Subjective: MEWS Yellow overnight, for tachycardia and hypertension.  Appears resolved with pain medication. This morning patient reports he had severe pain overnight.  He is doing better this morning.  Objective: Vitals:   02/29/24 2121 02/29/24 2330 03/01/24 0130 03/01/24 0556  BP: (!) 131/115 129/88 126/73 134/74  Pulse: (!) 116 (!) 103 86 (!) 59  Resp: 18 18 18 18   Temp: 99 F (37.2 C) 98.9 F (37.2 C) 97.9 F (36.6 C) 97.8 F (36.6 C)  TempSrc: Oral   Oral  SpO2: 97% 96% 100% 96%  Weight:      Height:        Intake/Output Summary (Last 24 hours) at 03/01/2024 0837 Last data filed at 03/01/2024 0500 Gross per 24 hour  Intake 240 ml  Output 1300 ml  Net -1060 ml   Filed Weights   02/28/24 0554  Weight: 77.5 kg    Examination: General exam: Appears calm and comfortable, NAD  Respiratory system: No work of breathing, symmetric chest wall expansion Cardiovascular system: S1 & S2 heard, RRR.  Gastrointestinal system: Abdomen is nondistended, soft and nontender.  Neuro: Alert and oriented. No focal neurological deficits. Extremities: Right leg with postoperative bandages, swelling improving.   Psychiatry: Demonstrates appropriate judgement and insight. Mood & affect appropriate for situation.   Assessment & Plan:  Principal Problem:   Femur fracture, right (HCC) Active  Problems:   Closed displaced intertrochanteric fracture of right femur (HCC)    Right proximal femur intertrochanteric  fracture - 4/17 reduction and internal fixation of displaced four-part intertrochanteric right hip fracture. - No concerns of pathologic fracture per orthopedic surgery - Continue PT/OT.  Continue to encourage patient on the importance of out of bed and early mobilization with weightbearing on left leg. - Patient does have potential to pivot with independent transfers however due to recent right rotator cuff repair 2/17 has limited lifting and weightbearing on right upper extremity. - TOC has been consulted to assist in SNF/rehab placement. - Continue with scheduled Toradol  and as needed Oxy and Tylenol  for pain control.  Patient continues to request pain medications around-the-clock.  We will titrate as needed  DVT right lower extremity - Doppler confirms acute DVT in right cheerier tibial and peroneal vein.  This was first observed by anesthesiologist in the preoperative period.  I do suspect that it was present on arrival. - Currently on loading dose of Eliquis , will continue.  Will need Eliquis  for at least 3 months   Right ankle swelling - X-ray without acute fracture.  Does note effusion and heterotopic ossification, suspected from old injury - Swelling is improving  Uncontrolled Type 1 diabetes with hyperglycemia - Hemoglobin A1c 8.2% - Patient has CGM and prefers to follow his own sliding scale. Continue to adjust accordingly. - Will resume his home dose Lantus  twice daily. - Patient reports he has recently been eating poorly contributing to  his higher A1c.  Reports he has previously been closer to 7%  Anxiety Depression - Resume home meds  DVT prophylaxis: Eliquis  Disposition: Inpatient, medically patient is on p.o. medications and can likely be discharged.  Have consulted TOC for rehab planning.  Continue physical therapy while admitted Consultants:   Treatment Team:  Consulting Physician: Venus Ginsberg, MD  Procedures:    Antimicrobials:  Anti-infectives (From admission, onward)    Start     Dose/Rate Route Frequency Ordered Stop   02/27/24 1800  ceFAZolin  (ANCEF ) IVPB 2g/100 mL premix        2 g 200 mL/hr over 30 Minutes Intravenous Every 6 hours 02/27/24 1553 02/28/24 1425   02/27/24 1229  ceFAZolin  (ANCEF ) IVPB 2g/100 mL premix        2 g 200 mL/hr over 30 Minutes Intravenous 30 min pre-op 02/27/24 1229 02/27/24 1318       Data Reviewed: I have personally reviewed following labs and imaging studies CBC: Recent Labs  Lab 02/26/24 1636 02/27/24 0942 02/28/24 0434 02/29/24 0449  WBC 9.6 9.8 10.6* 8.0  NEUTROABS 7.2 6.2  --   --   HGB 13.5 12.3* 9.5* 9.1*  HCT 40.3 36.1* 27.9* 26.4*  MCV 90.6 90.9 91.5 90.4  PLT 269 217 174 163   Basic Metabolic Panel: Recent Labs  Lab 02/26/24 1636 02/27/24 0942 02/28/24 0434  NA 137 137 132*  K 4.9 4.5 4.6  CL 105 105 102  CO2 25 28 23   GLUCOSE 253* 145* 308*  BUN 19 20 21*  CREATININE 1.01 0.95 0.87  CALCIUM  8.7* 8.4* 7.9*   GFR: Estimated Creatinine Clearance: 87.6 mL/min (by C-G formula based on SCr of 0.87 mg/dL). Liver Function Tests: Recent Labs  Lab 02/27/24 0942  AST 34  ALT 25  ALKPHOS 51  BILITOT 0.9  PROT 5.4*  ALBUMIN  3.1*   CBG: Recent Labs  Lab 02/28/24 1650 02/28/24 2025 02/29/24 1200 02/29/24 2120 03/01/24 0808  GLUCAP 326* 276* 253* 205* 174*    Recent Results (from the past 240 hours)  Surgical PCR screen     Status: None   Collection Time: 02/27/24  4:04 AM   Specimen: Nasal Mucosa; Nasal Swab  Result Value Ref Range Status   MRSA, PCR NEGATIVE NEGATIVE Final   Staphylococcus aureus NEGATIVE NEGATIVE Final    Comment: (NOTE) The Xpert SA Assay (FDA approved for NASAL specimens in patients 64 years of age and older), is one component of a comprehensive surveillance program. It is not intended to diagnose infection nor  to guide or monitor treatment. Performed at [facility], [address] [place], [place] [address]      Radiology Studies: No results found.   Scheduled Meds:  apixaban   10 mg Oral BID   Followed by   Cecily Cohen ON 03/06/2024] apixaban   5 mg Oral BID   aspirin  EC  81 mg Oral Daily   docusate sodium   100 mg Oral BID   insulin  aspart  0-15 Units Subcutaneous TID WC   insulin  aspart  0-5 Units Subcutaneous QHS   insulin  glargine-yfgn  15 Units Subcutaneous BID   polyethylene glycol  17 g Oral BID   traZODone   100 mg Oral QHS   Continuous Infusions:     LOS: 4 days  MDM: Patient is high risk for one or more organ failure.  They necessitate ongoing hospitalization for continued IV therapies and subsequent lab monitoring. Total time spent interpreting labs and vitals, coordinating care amongst consultants  and care team members, directly assessing and discussing care with the patient and/or family: 55 min    Waynetta Metheny, DO [facility]  To contact the attending physician between 7A-7P please use Epic Chat. To contact the covering physician during after hours 7P-7A, please review Amion.   03/01/2024, 8:37 AM   *This document has been created with the assistance of dictation software. Please excuse typographical errors. *

## 2024-03-01 NOTE — Plan of Care (Signed)
  Problem: Education: Goal: Ability to describe self-care measures that may prevent or decrease complications (Diabetes Survival Skills Education) will improve Outcome: Progressing   Problem: Metabolic: Goal: Ability to maintain appropriate glucose levels will improve Outcome: Progressing   Problem: Nutritional: Goal: Maintenance of adequate nutrition will improve Outcome: Progressing   Problem: Skin Integrity: Goal: Risk for impaired skin integrity will decrease Outcome: Progressing   Problem: Clinical Measurements: Goal: Ability to maintain clinical measurements within normal limits will improve Outcome: Progressing   Problem: Activity: Goal: Risk for activity intolerance will decrease Outcome: Progressing   Problem: Pain Managment: Goal: General experience of comfort will improve and/or be controlled Outcome: Progressing   Problem: Safety: Goal: Ability to remain free from injury will improve Outcome: Progressing   Problem: Skin Integrity: Goal: Risk for impaired skin integrity will decrease Outcome: Progressing

## 2024-03-01 NOTE — Progress Notes (Signed)
 Subjective: 3 Days Post-Op Procedure(s) (LRB): FIXATION, FRACTURE, INTERTROCHANTERIC, WITH INTRAMEDULLARY ROD (Right) Patient reports pain as mild and moderate, improving from previous days.   Patient seen in rounds with Dr. Daun Epstein. Patient is well, and has had no acute complaints or problems. Admits to having some nausea, seems to be improving today, patient eating breakfast during provider visit. Denies any CP, SOB, vomiting, fevers or chills We will continue therapy today. Plan is to go Home vs SNF after hospital stay. Patient states he does not feel ready to go home and is amenable to rehab.  Objective: Vital signs in last 24 hours: Temp:  [97.8 F (36.6 C)-99 F (37.2 C)] 97.8 F (36.6 C) (04/20 0556) Pulse Rate:  [59-116] 59 (04/20 0556) Resp:  [16-18] 18 (04/20 0556) BP: (126-134)/(64-115) 134/74 (04/20 0556) SpO2:  [96 %-100 %] 96 % (04/20 0556)  Intake/Output from previous day:  Intake/Output Summary (Last 24 hours) at 03/01/2024 0855 Last data filed at 03/01/2024 0500 Gross per 24 hour  Intake 240 ml  Output 1000 ml  Net -760 ml    Intake/Output this shift: No intake/output data recorded.  Labs: Recent Labs    02/27/24 0942 02/28/24 0434 02/29/24 0449  HGB 12.3* 9.5* 9.1*   Recent Labs    02/28/24 0434 02/29/24 0449  WBC 10.6* 8.0  RBC 3.05* 2.92*  HCT 27.9* 26.4*  PLT 174 163   Recent Labs    02/27/24 0942 02/28/24 0434  NA 137 132*  K 4.5 4.6  CL 105 102  CO2 28 23  BUN 20 21*  CREATININE 0.95 0.87  GLUCOSE 145* 308*  CALCIUM  8.4* 7.9*   No results for input(s): "LABPT", "INR" in the last 72 hours.   EXAM General - Patient is Alert, Appropriate, and Oriented Extremity - Neurologically intact Neurovascular intact Sensation intact distally Intact pulses distally Dorsiflexion/Plantar flexion intact No cellulitis present Compartment soft Dressing - Dressing C/D/I, scant drainage Motor Function - intact, moving foot and toes well on  exam.  Past Medical History:  Diagnosis Date   Diabetes (HCC)    Diabetes mellitus without complication (HCC)    age 58   Erectile dysfunction    Proteinuria 2006    Assessment/Plan: 3 Days Post-Op Procedure(s) (LRB): FIXATION, FRACTURE, INTERTROCHANTERIC, WITH INTRAMEDULLARY ROD (Right) Principal Problem:   Femur fracture, right (HCC) Active Problems:   Closed displaced intertrochanteric fracture of right femur (HCC)  Estimated body mass index is 26.75 kg/m as calculated from the following:   Height as of this encounter: 5' 7.01" (1.702 m).   Weight as of this encounter: 77.5 kg. Advance diet Up with therapy Patient will continue to work with physical therapy, WBAT to RLE BP improved, stable Labs show last hgb at 9.1 (CBC pending) and most recent glucose at 174  Patient also found to have a DVT of RLE, believe this to be pre-surgical issue. Will need to continue eliquis  postoperatively Also had recent R rotator cuff repair, limited weight bearing on RUE. Trapeze over bar in place TOC order in place to help with placement to SNF  DVT prophylaxis: Eliquis  and SCDs Patient will follow-up with [facility] clinic orthopedics in 2 weeks for reevaluation  Wadie Guile, PA-C [facility] 03/01/2024, 8:55 AM

## 2024-03-02 ENCOUNTER — Inpatient Hospital Stay

## 2024-03-02 DIAGNOSIS — S72141A Displaced intertrochanteric fracture of right femur, initial encounter for closed fracture: Secondary | ICD-10-CM | POA: Diagnosis not present

## 2024-03-02 LAB — GLUCOSE, CAPILLARY
Glucose-Capillary: 245 mg/dL — ABNORMAL HIGH (ref 70–99)
Glucose-Capillary: 180 mg/dL — ABNORMAL HIGH (ref 70–99)
Glucose-Capillary: 189 mg/dL — ABNORMAL HIGH (ref 70–99)
Glucose-Capillary: 136 mg/dL — ABNORMAL HIGH (ref 70–99)

## 2024-03-02 MED ORDER — LACTULOSE 10 GM/15ML PO SOLN
10.0000 g | Freq: Two times a day (BID) | ORAL | Status: DC
  Administered 2024-03-03: 10 g via ORAL
  Filled 2024-03-02 (×3): qty 30

## 2024-03-02 NOTE — Progress Notes (Signed)
 Occupational Therapy Treatment Patient Details Name: Craig Banks MRN: [id number] DOB: Jan 04, 1966 Today's Date: 03/02/2024   History of present illness TRISTIN VANDEUSEN is a 58 y.o. male with medical history significant of IDDM and recent R rotator cuff surgery; presented with mechanical fall and right femur fracture s/p R femur IM Nailing (WBAT R LE)   OT comments  Mr Rideout was seen for OT treatment on this date, initially refused session this AM. Upon arrival to room pt in bed, agreeable to tx with max encouragement. Pt requires MAX A don B socks at bed level. MIN A x2 sup>sit, good sitting balance. MIN A x2 + RW for bed>chair t/f. Constant redirection to avoid weightbearing in RUE. Pt self-limiting progress toward goals, will continue to follow POC. Discharge recommendation remains appropriate.        If plan is discharge home, recommend the following:  A lot of help with walking and/or transfers;A lot of help with bathing/dressing/bathroom   Equipment Recommendations  BSC/3in1    Recommendations for Other Services      Precautions / Restrictions Precautions Precautions: Fall Recall of Precautions/Restrictions: Impaired Precaution/Restrictions Comments: NWB RUE, ROM ok but not overhead. WBAT R LE Restrictions Weight Bearing Restrictions Per Provider Order: Yes RUE Weight Bearing Per Provider Order: Non weight bearing RLE Weight Bearing Per Provider Order: Weight bearing as tolerated       Mobility Bed Mobility Overal bed mobility: Needs Assistance Bed Mobility: Supine to Sit     Supine to sit: Min assist, +2 for physical assistance          Transfers Overall transfer level: Needs assistance Equipment used: Rolling walker (2 wheels) Transfers: Sit to/from Stand, Bed to chair/wheelchair/BSC Sit to Stand: Min assist, +2 physical assistance     Step pivot transfers: Contact guard assist           Balance Overall balance assessment: Needs  assistance Sitting-balance support: No upper extremity supported, Feet supported Sitting balance-Leahy Scale: Good     Standing balance support: Bilateral upper extremity supported Standing balance-Leahy Scale: Fair                             ADL either performed or assessed with clinical judgement   ADL Overall ADL's : Needs assistance/impaired                                       General ADL Comments: MAX A don B socks at bed level. MIN A x2 + RW for simulated BSC t/f     Communication Communication Communication: No apparent difficulties   Cognition Arousal: Alert Behavior During Therapy: Anxious, WFL for tasks assessed/performed Cognition: No apparent impairments                               Following commands: Intact        Cueing   Cueing Techniques: Verbal cues, Tactile cues  Exercises              Pertinent Vitals/ Pain       Pain Assessment Pain Assessment: Faces Faces Pain Scale: Hurts even more Pain Location: R hip/thigh/buttock Pain Descriptors / Indicators: Discomfort, Grimacing, Guarding, Spasm Pain Intervention(s): Limited activity within patient's tolerance, Repositioned   Frequency  Min 2X/week  Progress Toward Goals  OT Goals(current goals can now be found in the care plan section)  Progress towards OT goals: Progressing toward goals  Acute Rehab OT Goals OT Goal Formulation: With patient/family Time For Goal Achievement: 03/13/24 Potential to Achieve Goals: Good ADL Goals Pt Will Perform Grooming: with modified independence;standing Pt Will Perform Lower Body Dressing: sit to/from stand;with min assist Pt Will Transfer to Toilet: with modified independence;ambulating;regular height toilet  Plan      Co-evaluation    PT/OT/SLP Co-Evaluation/Treatment: Yes Reason for Co-Treatment: Necessary to address cognition/behavior during functional activity PT goals addressed during  session: Mobility/safety with mobility OT goals addressed during session: ADL's and self-care      AM-PAC OT "6 Clicks" Daily Activity     Outcome Measure   Help from another person eating meals?: None Help from another person taking care of personal grooming?: None Help from another person toileting, which includes using toliet, bedpan, or urinal?: A Lot Help from another person bathing (including washing, rinsing, drying)?: A Lot Help from another person to put on and taking off regular upper body clothing?: None Help from another person to put on and taking off regular lower body clothing?: A Lot 6 Click Score: 18    End of Session Equipment Utilized During Treatment: Rolling walker (2 wheels)  OT Visit Diagnosis: Other abnormalities of gait and mobility (R26.89);Muscle weakness (generalized) (M62.81)   Activity Tolerance Patient limited by pain   Patient Left in chair;with call bell/phone within reach;with family/visitor present   Nurse Communication          Time: 1610-9604 OT Time Calculation (min): 29 min  Charges: OT General Charges $OT Visit: 1 Visit OT Treatments $Self Care/Home Management : 8-22 mins  Gordan Latina, M.S. OTR/L  03/02/24, 3:51 PM  ascom [phone]

## 2024-03-02 NOTE — Progress Notes (Signed)
 Subjective: 4 Days Post-Op Procedure(s) (LRB): FIXATION, FRACTURE, INTERTROCHANTERIC, WITH INTRAMEDULLARY ROD (Right) Patient reports pain as mild.   Patient is well, and has had no acute complaints or problems. Admits to having some nausea. Denies any CP, SOB, vomiting, fevers or chills We will continue therapy today. Provider visit during OT work. OT states that patient was not very willing to work this morning and still has yet to walk. Plan is to go Home vs SNF after hospital stay. Patient states he does not feel ready to go home and is amenable to rehab.  Objective: Vital signs in last 24 hours: Temp:  [98.4 F (36.9 C)-98.9 F (37.2 C)] 98.4 F (36.9 C) (04/21 0739) Pulse Rate:  [95-108] 103 (04/21 0739) Resp:  [16-20] 20 (04/21 0739) BP: (105-121)/(61-67) 118/67 (04/21 0739) SpO2:  [93 %-98 %] 95 % (04/21 0739)  Intake/Output from previous day:  Intake/Output Summary (Last 24 hours) at 03/02/2024 1120 Last data filed at 03/02/2024 1038 Gross per 24 hour  Intake 240 ml  Output --  Net 240 ml    Intake/Output this shift: No intake/output data recorded.  Labs: Recent Labs    02/29/24 0449 03/01/24 0848  HGB 9.1* 8.9*   Recent Labs    02/29/24 0449 03/01/24 0848  WBC 8.0 8.8  RBC 2.92* 2.98*  HCT 26.4* 26.2*  PLT 163 196   Recent Labs    03/01/24 0848  NA 132*  K 4.1  CL 99  CO2 26  BUN 15  CREATININE 0.88  GLUCOSE 196*  CALCIUM  8.0*   No results for input(s): "LABPT", "INR" in the last 72 hours.   EXAM General - Patient is Alert, Appropriate, and Oriented Extremity - Neurologically intact Neurovascular intact Sensation intact distally Intact pulses distally Dorsiflexion/Plantar flexion intact No cellulitis present Compartment soft Dressing - Dressing C/D/I, scant drainage Motor Function - intact, moving foot and toes well on exam.  Past Medical History:  Diagnosis Date   Diabetes (HCC)    Diabetes mellitus without complication (HCC)     age 58   Erectile dysfunction    Proteinuria 2006    Assessment/Plan: 4 Days Post-Op Procedure(s) (LRB): FIXATION, FRACTURE, INTERTROCHANTERIC, WITH INTRAMEDULLARY ROD (Right) Principal Problem:   Femur fracture, right (HCC) Active Problems:   Closed displaced intertrochanteric fracture of right femur (HCC)  Estimated body mass index is 26.75 kg/m as calculated from the following:   Height as of this encounter: 5' 7.01" (1.702 m).   Weight as of this encounter: 77.5 kg. Advance diet Up with therapy Patient will continue to work with physical therapy, WBAT to RLE. Encouraged to walk with OT and PT today Labs show last hgb at 8.9 Patient also found to have a DVT of RLE, believe this to be pre-surgical issue. Will need to continue eliquis  postoperatively Also had recent R rotator cuff repair, limited weight bearing on RUE. Trapeze over bar in place TOC to help with placement to SNF  DVT prophylaxis: Eliquis  and SCDs Patient will follow-up with [facility] clinic orthopedics in 2 weeks for reevaluation  Wadie Guile, PA-C [facility] 03/02/2024, 11:20 AM

## 2024-03-02 NOTE — Progress Notes (Signed)
 Physical Therapy Treatment Patient Details Name: Craig Banks MRN: [id number] DOB: December 31, 1965 Today's Date: 03/02/2024   History of Present Illness Craig Banks is a 58 y.o. male with medical history significant of IDDM and recent R rotator cuff surgery; presented with mechanical fall and right femur fracture s/p R femur IM Nailing (WBAT R LE)    PT Comments  Pt received upright in bed agreeable to PT/OT co-treat as discussed in am as pt refused OT earlier. PT is mildly irritable with continuous rehab attempts stating lack of rest and constant pain despite coordinating pain meds with NSG. Extensive time needed for pt due to pain and being anxious with minA and LUE on trapeze bar to transfer from supine to sitting EOB. Ultimately did rely on minA+2 for transfer for RLE and torso to ensure no use of RUE. minA+1 for STS to RW at EOB with poor NWB compliance needing regular VC's to maintain NWB. Very light RUE use and CGA for SPT to recliner with excellent stability noted. Did have some mild difficulty taking mini steps backwards to recliner needing minA for descent due to lowered surface of recliner. Decreasing frequency to 7x/week due to poor compliance due to pain and clear frustration with BID frequency. Pt left upright in recliner with all needs in reach. D/c recs remain appropriate.    If plan is discharge home, recommend the following: A lot of help with walking and/or transfers;A lot of help with bathing/dressing/bathroom;Help with stairs or ramp for entrance   Can travel by private vehicle     No  Equipment Recommendations  Other (comment)    Recommendations for Other Services       Precautions / Restrictions Precautions Precautions: Fall Recall of Precautions/Restrictions: Impaired Precaution/Restrictions Comments: NWB RUE, ROM ok but not overhead. WBAT R LE Restrictions Weight Bearing Restrictions Per Provider Order: Yes RUE Weight Bearing Per Provider Order: Non  weight bearing RLE Weight Bearing Per Provider Order: Weight bearing as tolerated Other Position/Activity Restrictions: R shoulder recent RTC surgery per clarification with  surgeon previous date; NWB RUE, no strengthening     Mobility  Bed Mobility Overal bed mobility: Needs Assistance Bed Mobility: Supine to Sit     Supine to sit: Min assist, HOB elevated Sit to supine: Min assist, +2 for physical assistance   General bed mobility comments: transfers to R side ob bed for p.m. session. minA for RLE and minA+2 for torso to sit EOB despite LUE on trapeze. Patient Response: Anxious, Cooperative  Transfers Overall transfer level: Needs assistance Equipment used: Rolling walker (2 wheels) Transfers: Sit to/from Stand, Bed to chair/wheelchair/BSC Sit to Stand: Min assist, From elevated surface   Step pivot transfers: Contact guard assist       General transfer comment: no physical support needed to SPT to recliner.    Ambulation/Gait               General Gait Details: pt continues to decline due to nausea and pain   Stairs             Wheelchair Mobility     Tilt Bed Tilt Bed Patient Response: Anxious, Cooperative  Modified Rankin (Stroke Patients Only)       Balance Overall balance assessment: Needs assistance Sitting-balance support: No upper extremity supported, Feet supported Sitting balance-Leahy Scale: Good     Standing balance support: Bilateral upper extremity supported Standing balance-Leahy Scale: Fair  Communication Communication Communication: No apparent difficulties  Cognition Arousal: Alert Behavior During Therapy: Anxious, WFL for tasks assessed/performed   PT - Cognitive impairments: No apparent impairments                         Following commands: Intact      Cueing Cueing Techniques: Verbal cues, Tactile cues  Exercises General Exercises - Lower Extremity Ankle  Circles/Pumps: AROM, Both, 10 reps, Supine Quad Sets: AROM, Strengthening, Right, 10 reps, Supine Hip ABduction/ADduction: AAROM, Strengthening, Right, 10 reps, Supine    General Comments        Pertinent Vitals/Pain Pain Assessment Pain Assessment: Faces Faces Pain Scale: Hurts even more Pain Location: R hip/thigh/buttock Pain Descriptors / Indicators: Discomfort, Grimacing, Guarding, Spasm Pain Intervention(s): Limited activity within patient's tolerance, Monitored during session, Premedicated before session, Ice applied    Home Living                          Prior Function            PT Goals (current goals can now be found in the care plan section) Acute Rehab PT Goals Patient Stated Goal: none truely stated- Author did discuss with pt and girlfriend DC recommendations and how placement works. pt does seem receptive to SNF placement PT Goal Formulation: With patient Time For Goal Achievement: 03/11/24 Potential to Achieve Goals: Fair Progress towards PT goals: Progressing toward goals    Frequency    7X/week      PT Plan      Co-evaluation PT/OT/SLP Co-Evaluation/Treatment: Yes Reason for Co-Treatment: Other (comment) PT goals addressed during session: Mobility/safety with mobility OT goals addressed during session: ADL's and self-care      AM-PAC PT "6 Clicks" Mobility   Outcome Measure  Help needed turning from your back to your side while in a flat bed without using bedrails?: A Lot Help needed moving from lying on your back to sitting on the side of a flat bed without using bedrails?: A Lot Help needed moving to and from a bed to a chair (including a wheelchair)?: A Lot Help needed standing up from a chair using your arms (e.g., wheelchair or bedside chair)?: A Lot Help needed to walk in hospital room?: A Lot Help needed climbing 3-5 steps with a railing? : Total 6 Click Score: 11    End of Session Equipment Utilized During Treatment:  Gait belt Activity Tolerance: Patient limited by pain Patient left: in chair;with call bell/phone within reach Nurse Communication: Mobility status PT Visit Diagnosis: Other abnormalities of gait and mobility (R26.89);Pain;History of falling (Z91.81);Difficulty in walking, not elsewhere classified (R26.2) Pain - Right/Left: Right Pain - part of body: Hip     Time: 1610-9604 PT Time Calculation (min) (ACUTE ONLY): 30 min  Charges:    $Therapeutic Activity: 8-22 mins PT General Charges $$ ACUTE PT VISIT: 1 Visit                    Marc Senior. Fairly IV, PT, DPT Physical Therapist- [place]  [facility]  03/02/2024, 3:19 PM

## 2024-03-02 NOTE — Progress Notes (Signed)
 Physical Therapy Treatment Patient Details Name: Craig Banks MRN: [id number] DOB: [birth year] Today's Date: 03/02/2024   History of Present Illness Craig Banks is a 58 y.o. male with medical history significant of IDDM and recent R rotator cuff surgery; presented with mechanical fall and right femur fracture s/p R femur IM Nailing (WBAT R LE)    PT Comments  Pt received upright in bed finishing breakfast agreeable to PT with encouragement. Pre medicated per RN prior to session. Pt remains very anxious, taking extensive time and reassurance for all mobility efforts. Consistent reminders via VC's on importance of NWB on RUE with poor carryover. Overall greatly limited by his anxiousness and pain despite adequate pain management still reliant on bed features and modA for STS efforts at bedside. Deferring further mobility as pt reports nausea and fear of vomiting. Set expectation for afternoon session of transfer to recliner. Pt left sitting EOB finishing breakfast with all needs in reach.    If plan is discharge home, recommend the following: A lot of help with walking and/or transfers;A lot of help with bathing/dressing/bathroom;Help with stairs or ramp for entrance   Can travel by private vehicle     No  Equipment Recommendations  Other (comment) (TBD by next venue of care)    Recommendations for Other Services       Precautions / Restrictions Precautions Precautions: Fall Recall of Precautions/Restrictions: Impaired Precaution/Restrictions Comments: NWB RUE, ROM ok but not overhead. WBAT R LE Restrictions Weight Bearing Restrictions Per Provider Order: Yes RUE Weight Bearing Per Provider Order: Non weight bearing RLE Weight Bearing Per Provider Order: Weight bearing as tolerated Other Position/Activity Restrictions: R shoulder recent RTC surgery per clarification with  surgeon previous date; NWB RUE, no strengthening     Mobility  Bed Mobility Overal bed mobility:  Needs Assistance Bed Mobility: Supine to Sit     Supine to sit: Min assist     General bed mobility comments: Continues to request L side of bed tranasfer. Increased time nad gait belt to mobilzie RLE towards EOB. Reliant on VC's for RUE NWB status. Use of hooking LLE at [facility] and Trapeze bar to transfer to sitting EOB.    Transfers Overall transfer level: Needs assistance Equipment used: Rolling walker (2 wheels) Transfers: Sit to/from Stand Sit to Stand: Mod assist           General transfer comment: modA with bed elevated with max VC's to maintain NWB on RUE.    Ambulation/Gait               General Gait Details: pt continues to decline due to nausea and pain   Stairs             Wheelchair Mobility     Tilt Bed    Modified Rankin (Stroke Patients Only)       Balance Overall balance assessment: Needs assistance Sitting-balance support: No upper extremity supported, Feet supported Sitting balance-Leahy Scale: Good     Standing balance support: Bilateral upper extremity supported Standing balance-Leahy Scale: Fair                              Hotel manager: No apparent difficulties  Cognition Arousal: Alert Behavior During Therapy: Anxious, WFL for tasks assessed/performed   PT - Cognitive impairments: No apparent impairments  Following commands: Intact      Cueing Cueing Techniques: Verbal cues, Tactile cues  Exercises General Exercises - Lower Extremity Ankle Circles/Pumps: AROM, Both, 10 reps, Supine Quad Sets: AROM, Strengthening, Right, 10 reps, Supine Hip ABduction/ADduction: AAROM, Strengthening, Right, 10 reps, Supine    General Comments        Pertinent Vitals/Pain Pain Assessment Pain Assessment: Faces Faces Pain Scale: Hurts even more Pain Location: R hip/thigh/buttock Pain Descriptors / Indicators: Discomfort, Grimacing, Guarding, Spasm Pain  Intervention(s): Limited activity within patient's tolerance, Monitored during session, Premedicated before session, Repositioned    Home Living                          Prior Function            PT Goals (current goals can now be found in the care plan section) Acute Rehab PT Goals Patient Stated Goal: none truely stated- Author did discuss with pt and girlfriend DC recommendations and how placement works. pt does seem receptive to SNF placement PT Goal Formulation: With patient Time For Goal Achievement: 03/11/24 Potential to Achieve Goals: Fair Progress towards PT goals: Progressing toward goals    Frequency    BID      PT Plan      Co-evaluation              AM-PAC PT "6 Clicks" Mobility   Outcome Measure  Help needed turning from your back to your side while in a flat bed without using bedrails?: A Little Help needed moving from lying on your back to sitting on the side of a flat bed without using bedrails?: A Little Help needed moving to and from a bed to a chair (including a wheelchair)?: A Lot Help needed standing up from a chair using your arms (e.g., wheelchair or bedside chair)?: A Lot Help needed to walk in hospital room?: A Lot Help needed climbing 3-5 steps with a railing? : Total 6 Click Score: 13    End of Session Equipment Utilized During Treatment: Gait belt Activity Tolerance: Patient limited by pain Patient left: with call bell/phone within reach (sitting EOB) Nurse Communication: Mobility status PT Visit Diagnosis: Other abnormalities of gait and mobility (R26.89);Pain;History of falling (Z91.81);Difficulty in walking, not elsewhere classified (R26.2) Pain - Right/Left: Right Pain - part of body: Hip     Time: 0912-0940 PT Time Calculation (min) (ACUTE ONLY): 28 min  Charges:    $Therapeutic Exercise: 8-22 mins $Therapeutic Activity: 23-37 mins PT General Charges $$ ACUTE PT VISIT: 1 Visit                    Marc Senior.  Fairly IV, PT, DPT Physical Therapist- [place]  [facility]  03/02/2024, 10:41 AM

## 2024-03-02 NOTE — Anesthesia Postprocedure Evaluation (Signed)
 Anesthesia Post Note  Patient: Craig Banks  Procedure(s) Performed: FIXATION, FRACTURE, INTERTROCHANTERIC, WITH INTRAMEDULLARY ROD (Right)  Patient location during evaluation: PACU Anesthesia Type: General Level of consciousness: awake and alert Pain management: pain level controlled Vital Signs Assessment: post-procedure vital signs reviewed and stable Respiratory status: spontaneous breathing, nonlabored ventilation and respiratory function stable Cardiovascular status: blood pressure returned to baseline and stable Postop Assessment: no apparent nausea or vomiting Anesthetic complications: no Comments: Hospitalist alerted to my pre-op findings of right lower leg pain with thigh swelling that was concerning for DVT. They stated an ultrasound would be ordered.    No notable events documented.   Last Vitals:  Vitals:   03/02/24 0358 03/02/24 0739  BP: 109/62 118/67  Pulse:  (!) 103  Resp: 16 20  Temp: 36.9 C 36.9 C  SpO2: 95% 95%    Last Pain:  Vitals:   03/02/24 0433  TempSrc:   PainSc: Asleep                 Baltazar Bonier

## 2024-03-02 NOTE — Progress Notes (Addendum)
 PROGRESS NOTE    Craig Banks  ZOX:[id number] DOB: Nov 03, 1966 DOA: 02/26/2024 PCP: Marcina Severe, PA-C  Chief Complaint  Patient presents with   [facility] Course:  Craig Banks is 58 y.o. male with insulin -dependent diabetes, who presented with mechanical fall while trying to catch a dog and subsequently sustained a right femur fracture.  Fracture confirmed by x-ray in the ED.  Labs and vitals mostly unremarkable except for hyperglycemia 253.  Patient received Dilaudid  and orthopedic surgery was consulted.  CT scan was ordered but patient reported he was unable to tolerate  Subjective: No acute events overnight.  Patient reports that he is in a significant amount of pain this morning.  He is resistant to working with occupational therapy because of this.  He also endorses some nausea after eating.  Objective: Vitals:   03/01/24 1457 03/01/24 2053 03/02/24 0358 03/02/24 0739  BP: 121/61 105/61 109/62 118/67  Pulse: (!) 108 95  (!) 103  Resp: 16 17 16 20   Temp: 98.9 F (37.2 C) 98.6 F (37 C) 98.5 F (36.9 C) 98.4 F (36.9 C)  TempSrc:  Oral    SpO2: 93% 98% 95% 95%  Weight:      Height:        Intake/Output Summary (Last 24 hours) at 03/02/2024 1252 Last data filed at 03/02/2024 1038 Gross per 24 hour  Intake 240 ml  Output --  Net 240 ml   Filed Weights   02/28/24 0554  Weight: 77.5 kg    Examination: General exam: Appears calm and comfortable, NAD  Respiratory system: No work of breathing, symmetric chest wall expansion Cardiovascular system: S1 & S2 heard, RRR.  Gastrointestinal system: Abdomen is nondistended, soft and nontender.  Neuro: Alert and oriented. No focal neurological deficits. Extremities: Right leg with postoperative bandages, swelling improving.  Diffusely tender to palpation Psychiatry: Demonstrates appropriate judgement and insight. Mood & affect appropriate for situation.   Assessment & Plan:  Principal Problem:   Femur  fracture, right (HCC) Active Problems:   Closed displaced intertrochanteric fracture of right femur (HCC)    Right proximal femur intertrochanteric  fracture - 4/17 reduction and internal fixation of displaced four-part intertrochanteric right hip fracture. - No concerns of pathologic fracture per orthopedic surgery - Continue physical therapy and Occupational Therapy.  Somewhat limited by patient's pain at this time - Continue to encourage out of bed, early mobilization, and attempted weightbearing of the left leg - Recent right rotator cuff repair 2/17 and has limited lifting or weightbearing on right upper extremity.  Should be able to pivot with independent transfers on left leg. - Given his current level of care for skilled nursing facility.  TOC has been consulted and is searching for bed offers - Continue with scheduled Toradol  and as needed Oxy and Tylenol  for pain control.  Patient continues to request pain medications around-the-clock.  We will cont titrate as needed  DVT right lower extremity - Doppler confirms acute DVT in right cheerier tibial and peroneal vein.  This was first observed by anesthesiologist in the preoperative period.  I do suspect that it was present on arrival. - Currently on loading dose of Eliquis , will continue.  Will need Eliquis  for at least 3 months   Normocytic anemia - Downtrending hemoglobin.  Have asked orthopedic surgery to reevaluate wound.  No obvious hematoma. - Some anemia may be secondary to DVT and intraoperative blood loss though reported less than 100 EBL. -No occult bleeding.  Does have leg swelling.  Right ankle swelling - X-ray without acute fracture.  Does note effusion and heterotopic ossification, suspected from old injury - Swelling is improved  Uncontrolled Type 1 diabetes with hyperglycemia - Hemoglobin A1c 8.2% - Patient has CGM and prefers to follow his own sliding scale.  Continue to adjust accordingly - Resume home dose  Lantus  twice daily - Patient reports he has recently been eating poorly contributing to his higher A1c.  Reports he has previously been closer to 7%  Anxiety Depression - Resume home meds  DVT prophylaxis: Eliquis  Disposition: Medically stable for discharge.  Entirely on p.o. medications.  Pending rehab placement.  TOC consulted and working on it. Consultants:  Treatment Team:  Consulting Physician: Venus Ginsberg, MD  Procedures:    Antimicrobials:  Anti-infectives (From admission, onward)    Start     Dose/Rate Route Frequency Ordered Stop   02/27/24 1800  ceFAZolin  (ANCEF ) IVPB 2g/100 mL premix        2 g 200 mL/hr over 30 Minutes Intravenous Every 6 hours 02/27/24 1553 02/28/24 1425   02/27/24 1229  ceFAZolin  (ANCEF ) IVPB 2g/100 mL premix        2 g 200 mL/hr over 30 Minutes Intravenous 30 min pre-op 02/27/24 1229 02/27/24 1318       Data Reviewed: I have personally reviewed following labs and imaging studies CBC: Recent Labs  Lab 02/26/24 1636 02/27/24 0942 02/28/24 0434 02/29/24 0449 03/01/24 0848  WBC 9.6 9.8 10.6* 8.0 8.8  NEUTROABS 7.2 6.2  --   --  6.0  HGB 13.5 12.3* 9.5* 9.1* 8.9*  HCT 40.3 36.1* 27.9* 26.4* 26.2*  MCV 90.6 90.9 91.5 90.4 87.9  PLT 269 217 174 163 196   Basic Metabolic Panel: Recent Labs  Lab 02/26/24 1636 02/27/24 0942 02/28/24 0434 03/01/24 0848  NA 137 137 132* 132*  K 4.9 4.5 4.6 4.1  CL 105 105 102 99  CO2 25 28 23 26   GLUCOSE 253* 145* 308* 196*  BUN 19 20 21* 15  CREATININE 1.01 0.95 0.87 0.88  CALCIUM  8.7* 8.4* 7.9* 8.0*   GFR: Estimated Creatinine Clearance: 86.6 mL/min (by C-G formula based on SCr of 0.88 mg/dL). Liver Function Tests: Recent Labs  Lab 02/27/24 0942 03/01/24 0848  AST 34 42*  ALT 25 39  ALKPHOS 51 97  BILITOT 0.9 1.4*  PROT 5.4* 5.4*  ALBUMIN  3.1* 2.5*   CBG: Recent Labs  Lab 03/01/24 0808 03/01/24 1213 03/01/24 1737 03/02/24 0735 03/02/24 1200  GLUCAP 174* 229* 160* 245* 180*     Recent Results (from the past 240 hours)  Surgical PCR screen     Status: None   Collection Time: 02/27/24  4:04 AM   Specimen: Nasal Mucosa; Nasal Swab  Result Value Ref Range Status   MRSA, PCR NEGATIVE NEGATIVE Final   Staphylococcus aureus NEGATIVE NEGATIVE Final    Comment: (NOTE) The Xpert SA Assay (FDA approved for NASAL specimens in patients 70 years of age and older), is one component of a comprehensive surveillance program. It is not intended to diagnose infection nor to guide or monitor treatment. Performed at [facility], [address] [place], [place] [address]      Radiology Studies: No results found.   Scheduled Meds:  apixaban   10 mg Oral BID   Followed by   Cecily Cohen ON 03/06/2024] apixaban   5 mg Oral BID   aspirin  EC  81 mg Oral Daily   insulin  aspart  0-20  Units Subcutaneous TID WC   insulin  aspart  0-5 Units Subcutaneous QHS   insulin  glargine-yfgn  15 Units Subcutaneous BID   ketorolac   30 mg Intravenous Q6H   polyethylene glycol  17 g Oral BID   traZODone   100 mg Oral QHS   Continuous Infusions:     LOS: 5 days  MDM: Patient is high risk for one or more organ failure.  They necessitate ongoing hospitalization for continued IV therapies and subsequent lab monitoring. Total time spent interpreting labs and vitals, coordinating care amongst consultants and care team members, directly assessing and discussing care with the patient and/or family: 55 min    Craig Ion, DO [facility]  To contact the attending physician between 7A-7P please use Epic Chat. To contact the covering physician during after hours 7P-7A, please review Amion.   03/02/2024, 12:52 PM   *This document has been created with the assistance of dictation software. Please excuse typographical errors. *

## 2024-03-02 NOTE — Progress Notes (Signed)
   CROSS COVER NOTE  Patient name: Craig Banks MRN: [id number] DOB : March 01, 1966  Concern as stated by nurse / staff   This patient takes 15 units of long acting insulin  BID and for some reason it got d/c'd today. In Dr Burton Casey note it says " Will resume his home dose Lantus  twice daily."    Review of Pertinent findings: Type I DM without long-acting insulin  ordered currently and hyperglycemia. Glucose 160> 186. Reviewed current attending documentation that indicates to continue long-acting  Assessment and  Interventions    Plan: Restart home insulin  Continue CBG monitoring

## 2024-03-02 NOTE — TOC Progression Note (Addendum)
 Transition of Care [facility]) - Progression Note    Patient Details  Name: Craig Banks MRN: [id number] Date of Birth: Apr 26, 1966  [facility] CM/SW Contact  Alexandra Ice, RN Phone Number: 03/02/2024, 10:38 AM  Clinical Narrative:    PASRR completed - [id number] A, FL2 and bedsearch completed. Once patient has bed offers will discuss patient.    Expected Discharge Plan: Home w Home Health Services Barriers to Discharge: Continued Medical Work up  Expected Discharge Plan and Services       Living arrangements for the past 2 months: Single Family Home                                       Social Determinants of Health (SDOH) Interventions SDOH Screenings   Food Insecurity: No Food Insecurity (02/27/2024)  Housing: Low Risk  (02/27/2024)  Transportation Needs: No Transportation Needs (02/27/2024)  Utilities: Not At Risk (02/27/2024)  Tobacco Use: Low Risk  (02/27/2024)    Readmission Risk Interventions     No data to display

## 2024-03-02 NOTE — NC FL2 (Signed)
 [place]  MEDICAID [id number] LEVEL OF CARE FORM     IDENTIFICATION  Patient Name: Craig Banks Birthdate: 10/19/1966 Sex: male Admission Date (Current Location): 02/26/2024  [facility] and [place] Number:  Chiropodist and Address:  [facility], [address], [place], [place] [address]      Provider Number:    Attending Physician Name and Address:  Roise Cleaver, DO  Relative Name and Phone Number:  Lenoria Raid  Sister  Emergency Contact  [phone]    Current Level of Care: Hospital Recommended Level of Care: Skilled Nursing Facility Prior Approval Number:    Date Approved/Denied: 03/02/24 PASRR Number: [id number]  Discharge Plan: SNF    Current Diagnoses: Patient Active Problem List   Diagnosis Date Noted   Closed displaced intertrochanteric fracture of right femur (HCC) 02/26/2024   Femur fracture, right (HCC) 02/26/2024   Acalculous cholecystitis 02/17/2015   Insulin  dependent diabetes mellitus 02/17/1995    Orientation RESPIRATION BLADDER Height & Weight     Self, Time, Situation, Place  Normal Continent Weight: 77.5 kg Height:  5' 7.01" (170.2 cm)  BEHAVIORAL SYMPTOMS/MOOD NEUROLOGICAL BOWEL NUTRITION STATUS      Continent Diet (Carb modified)  AMBULATORY STATUS COMMUNICATION OF NEEDS Skin   Extensive Assist Verbally Surgical wounds                       Personal Care Assistance Level of Assistance  Bathing, Feeding, Dressing Bathing Assistance: Limited assistance Feeding assistance: Limited assistance Dressing Assistance: Limited assistance     Functional Limitations Info             SPECIAL CARE FACTORS FREQUENCY  PT (By licensed PT), OT (By licensed OT)     PT Frequency: 5 times per week OT Frequency: 5 times per week            Contractures Contractures Info: Not present    Additional Factors Info  Code Status Code Status Info: Full Code             Current Medications  (03/02/2024):  This is the current hospital active medication list Current Facility-Administered Medications  Medication Dose Route Frequency Provider Last Rate Last Admin   acetaminophen  (TYLENOL ) tablet 325-650 mg  325-650 mg Oral Q6H PRN Poggi, John J, MD   650 mg at 03/01/24 1748   apixaban  (ELIQUIS ) tablet 10 mg  10 mg Oral BID Dezii, Alexandra, DO   10 mg at 03/02/24 1003   Followed by   Cecily Cohen ON 03/06/2024] apixaban  (ELIQUIS ) tablet 5 mg  5 mg Oral BID Dezii, Alexandra, DO       aspirin  EC tablet 81 mg  81 mg Oral Daily Poggi, John J, MD   81 mg at 03/02/24 1003   bisacodyl  (DULCOLAX) suppository 10 mg  10 mg Rectal Daily PRN Poggi, John J, MD       insulin  aspart (novoLOG ) injection 0-20 Units  0-20 Units Subcutaneous TID WC Dezii, Alexandra, DO   11 Units at 03/02/24 0759   insulin  aspart (novoLOG ) injection 0-5 Units  0-5 Units Subcutaneous QHS Elner Hahn, MD   2 Units at 03/01/24 2142   insulin  glargine-yfgn (SEMGLEE ) injection 15 Units  15 Units Subcutaneous BID Ree Candy, MD   15 Units at 03/02/24 1003   ketorolac  (TORADOL ) 30 MG/ML injection 30 mg  30 mg Intravenous Q6H Dezii, Alexandra, DO   30 mg at 03/02/24 0759   magnesium  hydroxide (MILK OF MAGNESIA)  suspension 30 mL  30 mL Oral Daily PRN Poggi, John J, MD       metoCLOPramide  (REGLAN ) tablet 5-10 mg  5-10 mg Oral Q8H PRN Poggi, John J, MD       Or   metoCLOPramide  (REGLAN ) injection 5-10 mg  5-10 mg Intravenous Q8H PRN Poggi, John J, MD       ondansetron  (ZOFRAN ) tablet 4 mg  4 mg Oral Q6H PRN Poggi, John J, MD   4 mg at 03/02/24 1009   Or   ondansetron  (ZOFRAN ) injection 4 mg  4 mg Intravenous Q6H PRN Poggi, John J, MD   4 mg at 03/01/24 1618   oxyCODONE  (Oxy IR/ROXICODONE ) immediate release tablet 5-10 mg  5-10 mg Oral Q6H PRN Dezii, Alexandra, DO   10 mg at 03/02/24 1003   phenol (CHLORASEPTIC) mouth spray 1 spray  1 spray Mouth/Throat PRN Dezii, Jennelle Mocha, DO   1 spray at 03/01/24 1450   polyethylene glycol  (MIRALAX  / GLYCOLAX ) packet 17 g  17 g Oral BID Dezii, Alexandra, DO   17 g at 03/01/24 6045   sodium phosphate  (FLEET) enema 1 enema  1 enema Rectal Once PRN Poggi, Kaylene Pascal, MD       traZODone  (DESYREL ) tablet 100 mg  100 mg Oral QHS Poggi, John J, MD   100 mg at 03/01/24 2141   zolpidem  (AMBIEN ) tablet 5 mg  5 mg Oral QHS PRN Dezii, Alexandra, DO   5 mg at 03/01/24 2140     Discharge Medications: Please see discharge summary for a list of discharge medications.  Relevant Imaging Results:  Relevant Lab Results:   Additional Information    Alexandra Ice, RN

## 2024-03-03 DIAGNOSIS — S72141A Displaced intertrochanteric fracture of right femur, initial encounter for closed fracture: Secondary | ICD-10-CM | POA: Diagnosis not present

## 2024-03-03 LAB — CBC WITH DIFFERENTIAL/PLATELET
Abs Immature Granulocytes: 0.04 10*3/uL (ref 0.00–0.07)
Basophils Absolute: 0 10*3/uL (ref 0.0–0.1)
Basophils Relative: 0 %
Eosinophils Absolute: 0.8 10*3/uL — ABNORMAL HIGH (ref 0.0–0.5)
Eosinophils Relative: 12 %
HCT: 23.9 % — ABNORMAL LOW (ref 39.0–52.0)
Hemoglobin: 8.3 g/dL — ABNORMAL LOW (ref 13.0–17.0)
Immature Granulocytes: 1 %
Lymphocytes Relative: 13 %
Lymphs Abs: 0.9 10*3/uL (ref 0.7–4.0)
MCH: 30.9 pg (ref 26.0–34.0)
MCHC: 34.7 g/dL (ref 30.0–36.0)
MCV: 88.8 fL (ref 80.0–100.0)
Monocytes Absolute: 0.9 10*3/uL (ref 0.1–1.0)
Monocytes Relative: 13 %
Neutro Abs: 4.1 10*3/uL (ref 1.7–7.7)
Neutrophils Relative %: 61 %
Platelets: 229 10*3/uL (ref 150–400)
RBC: 2.69 MIL/uL — ABNORMAL LOW (ref 4.22–5.81)
RDW: 13.1 % (ref 11.5–15.5)
WBC: 6.8 10*3/uL (ref 4.0–10.5)
nRBC: 0 % (ref 0.0–0.2)

## 2024-03-03 LAB — COMPREHENSIVE METABOLIC PANEL WITH GFR
ALT: 35 U/L (ref 0–44)
AST: 27 U/L (ref 15–41)
Albumin: 2.4 g/dL — ABNORMAL LOW (ref 3.5–5.0)
Alkaline Phosphatase: 127 U/L — ABNORMAL HIGH (ref 38–126)
Anion gap: 8 (ref 5–15)
BUN: 22 mg/dL — ABNORMAL HIGH (ref 6–20)
CO2: 28 mmol/L (ref 22–32)
Calcium: 8.2 mg/dL — ABNORMAL LOW (ref 8.9–10.3)
Chloride: 100 mmol/L (ref 98–111)
Creatinine, Ser: 1.02 mg/dL (ref 0.61–1.24)
GFR, Estimated: >60 mL/min (ref >60)
Glucose, Bld: 211 mg/dL — ABNORMAL HIGH (ref 70–99)
Potassium: 4 mmol/L (ref 3.5–5.1)
Sodium: 136 mmol/L (ref 135–145)
Total Bilirubin: 1.1 mg/dL (ref 0.0–1.2)
Total Protein: 5.2 g/dL — ABNORMAL LOW (ref 6.5–8.1)

## 2024-03-03 LAB — GLUCOSE, CAPILLARY
Glucose-Capillary: 184 mg/dL — ABNORMAL HIGH (ref 70–99)
Glucose-Capillary: 261 mg/dL — ABNORMAL HIGH (ref 70–99)
Glucose-Capillary: 235 mg/dL — ABNORMAL HIGH (ref 70–99)
Glucose-Capillary: 169 mg/dL — ABNORMAL HIGH (ref 70–99)

## 2024-03-03 NOTE — Progress Notes (Addendum)
 Subjective: 5 Days Post-Op Procedure(s) (LRB): FIXATION, FRACTURE, INTERTROCHANTERIC, WITH INTRAMEDULLARY ROD (Right) Patient reports pain as mild and improving from yesterday.   Patient is well, and has had no acute complaints or problems. Admits to having some nausea, again improving from yesterday. Denies any CP, SOB, vomiting, fevers or chills We will continue therapy today.  Plan is to go Home vs SNF after hospital stay. Patient states he does not feel ready to go home and is amenable to rehab.  Objective: Vital signs in last 24 hours: Temp:  [98.2 F (36.8 C)-98.7 F (37.1 C)] 98.7 F (37.1 C) (04/22 0913) Pulse Rate:  [54-126] 100 (04/22 0913) Resp:  [16-19] 16 (04/22 0913) BP: (94-135)/(47-71) 108/65 (04/22 0913) SpO2:  [95 %-99 %] 99 % (04/22 0913)  Intake/Output from previous day:  Intake/Output Summary (Last 24 hours) at 03/03/2024 1207 Last data filed at 03/02/2024 1500 Gross per 24 hour  Intake 360 ml  Output --  Net 360 ml    Intake/Output this shift: No intake/output data recorded.  Labs: Recent Labs    03/01/24 0848 03/03/24 0903  HGB 8.9* 8.3*   Recent Labs    03/01/24 0848 03/03/24 0903  WBC 8.8 6.8  RBC 2.98* 2.69*  HCT 26.2* 23.9*  PLT 196 229   Recent Labs    03/01/24 0848 03/03/24 0903  NA 132* 136  K 4.1 4.0  CL 99 100  CO2 26 28  BUN 15 22*  CREATININE 0.88 1.02  GLUCOSE 196* 211*  CALCIUM  8.0* 8.2*   No results for input(s): "LABPT", "INR" in the last 72 hours.  Recent Imaging: FINDINGS: Bones/Joint/Cartilage   Status post right femoral intramedullary nail and dynamic screw fixation of a comminuted intertrochanteric femur fracture. There is a long intramedullary nail component secured by 2 distal interlocking screws. The hardware appears well positioned without loosening. Mild residual displacement of the comminuted intertrochanteric fracture. No evidence pelvic fracture or dislocation. There are mild degenerative changes  of the right hip and within the lower lumbar spine facet joints. Mild degenerative changes of the right knee with a small joint effusion and small intra-articular loose bodies.   Ligaments   Suboptimally assessed by CT.   Muscles and Tendons   Ill-defined enlargement of the vastus lateralis and intermedius muscles in the proximal to mid thigh with ill-defined fluid and scattered air bubbles. There is inferior extension of this process along the peripheral aspect of the vastus lateralis muscle and iliotibial band with scattered air bubbles. No high density hematoma or other organized fluid collection identified on noncontrast imaging.   Soft tissues   As above, ill-defined fluid and scattered air bubbles throughout the quadriceps musculature and lateral subcutaneous tissues. There is generalized subcutaneous edema. There are postsurgical changes laterally in the right buttocks and proximal thigh with skin staples. No high-density hematoma, other work in eyes fluid collection or unexpected foreign body identified. Diffuse vascular calcifications are noted.   IMPRESSION: 1. Status post ORIF of a comminuted intertrochanteric right femur fracture with mild residual displacement. No evidence of hardware complication. 2. Ill-defined enlargement of the vastus lateralis and intermedius muscles in the proximal to mid thigh with ill-defined fluid and scattered air bubbles. Findings are likely postsurgical. Soft tissue infection not excluded. No high density hematoma or other organized fluid collection identified on noncontrast imaging. 3. Generalized subcutaneous edema. 4. Mild degenerative changes of the right hip, lower lumbar spine and right knee.  EXAM General - Patient is Alert, Appropriate, and Oriented Extremity -  Neurologically intact Neurovascular intact Sensation intact distally Intact pulses distally Dorsiflexion/Plantar flexion intact No cellulitis  present Compartment soft, mildly swollen, with dependent ecchymosis along posterior thigh Dressing - Dressing C/D/I, mild drainage most notably on proximal honeycomb, dressing removed, incision cleaned with topical alcohol and new honeycomb applied Motor Function - intact, moving foot and toes well on exam.  Past Medical History:  Diagnosis Date   Diabetes (HCC)    Diabetes mellitus without complication (HCC)    age 58   Erectile dysfunction    Proteinuria 2006    Assessment/Plan: 5 Days Post-Op Procedure(s) (LRB): FIXATION, FRACTURE, INTERTROCHANTERIC, WITH INTRAMEDULLARY ROD (Right) Principal Problem:   Femur fracture, right (HCC) Active Problems:   Closed displaced intertrochanteric fracture of right femur (HCC)  Estimated body mass index is 26.75 kg/m as calculated from the following:   Height as of this encounter: 5' 7.01" (1.702 m).   Weight as of this encounter: 77.5 kg. Advance diet Up with therapy CT ordered yesterday after discussion with internal medicine, see results above, no concerns of hardware failure, acute compartment issues. Low suspicion for infection given WBC of 6.8 and improving after surgery  Patient will continue to work with physical therapy, WBAT to RLE. Encouraged to walk with OT and PT today Labs show last hgb at 8.3, continue to trend Patient also found to have a DVT of RLE, believe this to be pre-surgical issue. Will need to continue eliquis  postoperatively Also had recent R rotator cuff repair, limited weight bearing on RUE. Trapeze over bar in place TOC to help with placement to SNF vs HHPT New honeycomb bandage placed along proximal incision  DVT prophylaxis: Eliquis  and SCDs Patient will follow-up with [facility] orthopedics in 2 weeks for reevaluation  Wadie Guile, PA-C [facility] 03/03/2024, 12:07 PM

## 2024-03-03 NOTE — Progress Notes (Addendum)
 Occupational Therapy Treatment Patient Details Name: Craig Banks MRN: [id number] DOB: 1966-06-14 Today's Date: 03/03/2024   History of present illness Craig Banks is a 58 y.o. male with medical history significant of IDDM and recent R rotator cuff surgery; presented with mechanical fall and right femur fracture s/p R femur IM Nailing (WBAT R LE)   OT comments  Upon arrival, Pt. Was resting in bed with his RUE raised, and positioned under his head. Pt. Education was provided about RUE precautions, avoiding overhead AROM, and NWBing through the RUE. Pt. Education was provided about A/E use for LE ADLs through visual demonstration. Pt. Will need additional education about A/E use for LE dressing. Reviewed anticipated ADL/IADL needs upon return home. Pt. Education was provided about the trapeze use with the LUE to assist with weight shifting, as Pt. Reports having difficulty using only his LUE. Pt. Continues to benefit from OT services for ADL training, A/E training, UE there. Ex. and Pt./caregiver education about home modification, and DME. OT discharge recommendations remain appropriate.      If plan is discharge home, recommend the following:  A lot of help with walking and/or transfers;A lot of help with bathing/dressing/bathroom   Equipment Recommendations    BSCommode   Recommendations for Other Services      Precautions / Restrictions Precautions Precautions: Fall Precaution/Restrictions Comments: NWB RUE, ROM ok but not overhead. WBAT R LE Restrictions Weight Bearing Restrictions Per Provider Order: Yes RUE Weight Bearing Per Provider Order: Non weight bearing RLE Weight Bearing Per Provider Order: Weight bearing as tolerated Other Position/Activity Restrictions: R shoulder recent RTC surgery per clarification with  surgeon previous date; NWB RUE, no strengthening       Mobility Bed Mobility               General bed mobility comments: Deferred. Pt. seen at  bed level    Transfers                   General transfer comment: Deferred     Balance                                           ADL either performed or assessed with clinical judgement   ADL Overall ADL's : Needs assistance/impaired                     Lower Body Dressing: Maximal assistance                      Extremity/Trunk Assessment Upper Extremity Assessment Upper Extremity Assessment: Overall WFL for tasks assessed            Vision  No change from baseline     Perception     Praxis     Communication Communication Communication: No apparent difficulties   Cognition Arousal: Alert Behavior During Therapy: Anxious, WFL for tasks assessed/performed Cognition: No apparent impairments                               Following commands: Intact        Cueing   Cueing Techniques: Verbal cues, Tactile cues  Exercises      Shoulder Instructions       General Comments      Pertinent Vitals/ Pain  Pain Assessment Pain Assessment: No/denies pain  Home Living                                          Prior Functioning/Environment              Frequency  Min 2X/week        Progress Toward Goals  OT Goals(current goals can now be found in the care plan section)  Progress towards OT goals: Progressing toward goals  Acute Rehab OT Goals Patient Stated Goal: To  regain independence OT Goal Formulation: With patient Time For Goal Achievement: 03/13/24 Potential to Achieve Goals: Good  Plan      Co-evaluation                 AM-PAC OT "6 Clicks" Daily Activity     Outcome Measure   Help from another person eating meals?: None   Help from another person toileting, which includes using toliet, bedpan, or urinal?: A Lot Help from another person bathing (including washing, rinsing, drying)?: A Lot Help from another person to put on and taking off  regular upper body clothing?: None Help from another person to put on and taking off regular lower body clothing?: A Lot 6 Click Score: 14    End of Session    OT Visit Diagnosis: Other abnormalities of gait and mobility (R26.89);Muscle weakness (generalized) (M62.81)   Activity Tolerance Patient tolerated treatment well   Patient Left in chair;with call bell/phone within reach;with family/visitor present   Nurse Communication          Time: 1610-9604 OT Time Calculation (min): 15 min  Charges: OT General Charges $OT Visit: 1 Visit OT Treatments $Self Care/Home Management : 8-22 mins  Duey Ghent, MS, OTR/L   Duey Ghent 03/03/2024, 1:52 PM

## 2024-03-03 NOTE — Plan of Care (Signed)
  Problem: Pain Managment: Goal: General experience of comfort will improve and/or be controlled Outcome: Progressing   Problem: Safety: Goal: Ability to remain free from injury will improve Outcome: Progressing   Problem: Skin Integrity: Goal: Risk for impaired skin integrity will decrease Outcome: Progressing

## 2024-03-03 NOTE — Progress Notes (Signed)
 Physical Therapy Treatment Patient Details Name: Craig Banks MRN: [id number] DOB: 03/23/1966 Today's Date: 03/03/2024   History of Present Illness Craig Banks is a 58 y.o. male with medical history significant of IDDM and recent R rotator cuff surgery; presented with mechanical fall and right femur fracture s/p R femur IM Nailing (WBAT R LE)    PT Comments  Pt received upright in bed agreeable to PT. Still with poor adherence to RUE restrictions found with RUE overhead in resting positioning and regular AROM during conversations despite VC's and education. Pt is in improved spirits today due to improved pain and ability to move his RLE on his own more regularly. Pt is able to transfer to EOB with LUE on trapeze and bridging BLE's to Eob maintaining NWB on RUE with VC's but needing minA+1 at torso to fully sit EOB. Able to stand to EOB at [facility] and bed elevated minA with RUE NWB and relax RUE on RW for light steadying completing ~10' of gait in room with R antalgic gait. Pt with excellent LE stability in standing but with diminished stance phase on RLE as expected for post op. Pt endorsing mild dizziness thus seated in recliner needing mod VC's for RUE positioning and MinA for descent with LUE on arm rest to recliner. Provided HEP packet with time spent reviewing exercises and performing some of his HEP as listed below. Pt left with all needs in reach. D/c recs remain appropriate.    If plan is discharge home, recommend the following: A lot of help with walking and/or transfers;A lot of help with bathing/dressing/bathroom;Help with stairs or ramp for entrance   Can travel by private vehicle     No  Equipment Recommendations  Other (comment) (TBD by next venue of care)    Recommendations for Other Services       Precautions / Restrictions Precautions Precautions: Fall Recall of Precautions/Restrictions: Impaired Restrictions Weight Bearing Restrictions Per Provider Order:  Yes RUE Weight Bearing Per Provider Order: Non weight bearing RLE Weight Bearing Per Provider Order: Weight bearing as tolerated Other Position/Activity Restrictions: R shoulder recent RTC surgery per clarification with  surgeon previous date; NWB RUE, no strengthening     Mobility  Bed Mobility Overal bed mobility: Needs Assistance Bed Mobility: Supine to Sit     Supine to sit: HOB elevated, Min assist     General bed mobility comments: LUE use on trapeze. ABle to brdige using RLE to scoot to EOB. VC's for NWB on RUE Patient Response: Cooperative  Transfers Overall transfer level: Needs assistance Equipment used: Rolling walker (2 wheels) Transfers: Sit to/from Stand Sit to Stand: Min assist, From elevated surface           General transfer comment: Maintained NWB on RUE with VC's    Ambulation/Gait Ambulation/Gait assistance: Contact guard assist, Supervision Gait Distance (Feet): 10 Feet Assistive device: Rolling walker (2 wheels) Gait Pattern/deviations: Step-to pattern, Antalgic       General Gait Details: Able to successfully ambulate 10' with RW. Demonstrates RUE resting on RW handle and not using RUE to mobilize. PRN turning assist for pt to RW due to RUE.   Stairs             Wheelchair Mobility     Tilt Bed Tilt Bed Patient Response: Cooperative  Modified Rankin (Stroke Patients Only)       Balance Overall balance assessment: Needs assistance Sitting-balance support: No upper extremity supported, Feet supported Sitting balance-Leahy Scale: Good  Standing balance support: Single extremity supported Standing balance-Leahy Scale: Fair                              Hotel manager: No apparent difficulties  Cognition Arousal: Alert Behavior During Therapy: WFL for tasks assessed/performed   PT - Cognitive impairments: No apparent impairments                       PT - Cognition  Comments: Pt does not appear anxious today. Greatly improved attitude and affect likely due to improved pain.        Cueing Cueing Techniques: Verbal cues, Tactile cues  Exercises General Exercises - Lower Extremity Ankle Circles/Pumps: AROM, Both, 10 reps, Supine Quad Sets: AROM, Strengthening, Right, 10 reps, Supine Long Arc Quad: AROM, Strengthening, Right, 5 reps, Seated Heel Slides: AAROM, Right, 5 reps, Supine Other Exercises Other Exercises: reviewed HEP hand out in room.    General Comments        Pertinent Vitals/Pain Pain Assessment Pain Assessment: Faces Faces Pain Scale: Hurts a little bit Pain Location: R hip/thigh/buttock Pain Descriptors / Indicators: Discomfort Pain Intervention(s): Limited activity within patient's tolerance, Repositioned    Home Living                          Prior Function            PT Goals (current goals can now be found in the care plan section) Acute Rehab PT Goals Patient Stated Goal: none truely stated- Author did discuss with pt and girlfriend DC recommendations and how placement works. pt does seem receptive to SNF placement PT Goal Formulation: With patient Time For Goal Achievement: 03/11/24 Potential to Achieve Goals: Fair Progress towards PT goals: Progressing toward goals    Frequency    7X/week      PT Plan      Co-evaluation              AM-PAC PT "6 Clicks" Mobility   Outcome Measure  Help needed turning from your back to your side while in a flat bed without using bedrails?: A Lot Help needed moving from lying on your back to sitting on the side of a flat bed without using bedrails?: A Lot Help needed moving to and from a bed to a chair (including a wheelchair)?: A Lot Help needed standing up from a chair using your arms (e.g., wheelchair or bedside chair)?: A Little Help needed to walk in hospital room?: A Little Help needed climbing 3-5 steps with a railing? : Total 6 Click Score:  13    End of Session Equipment Utilized During Treatment: Gait belt Activity Tolerance: Patient tolerated treatment well Patient left: in chair;with call bell/phone within reach;with chair alarm set Nurse Communication: Mobility status PT Visit Diagnosis: Other abnormalities of gait and mobility (R26.89);Pain;History of falling (Z91.81);Difficulty in walking, not elsewhere classified (R26.2) Pain - Right/Left: Right Pain - part of body: Hip     Time: 9147-8295 PT Time Calculation (min) (ACUTE ONLY): 25 min  Charges:    $Gait Training: 8-22 mins $Therapeutic Exercise: 8-22 mins PT General Charges $$ ACUTE PT VISIT: 1 Visit                     Marc Senior. Fairly IV, PT, DPT Physical Therapist- [place]  [facility]  03/03/2024, 3:07 PM

## 2024-03-03 NOTE — TOC Progression Note (Signed)
 Transition of Care [facility]) - Progression Note    Patient Details  Name: Craig Banks MRN: [id number] Date of Birth: 11-26-1965  Transition of Care [facility]) CM/SW Contact  Alexandra Ice, RN Phone Number: 03/03/2024, 4:07 PM  Clinical Narrative:     Patient continues to have uncontrolled pain. Sent additional bed search to [place] area. Will meet with patient to provide bed offers tomorrow.   Expected Discharge Plan: Home w Home Health Services Barriers to Discharge: Continued Medical Work up  Expected Discharge Plan and Services       Living arrangements for the past 2 months: Single Family Home                                       Social Determinants of Health (SDOH) Interventions SDOH Screenings   Food Insecurity: No Food Insecurity (02/27/2024)  Housing: Low Risk  (02/27/2024)  Transportation Needs: No Transportation Needs (02/27/2024)  Utilities: Not At Risk (02/27/2024)  Tobacco Use: Low Risk  (02/27/2024)    Readmission Risk Interventions     No data to display

## 2024-03-03 NOTE — Plan of Care (Signed)
   Problem: Education: Goal: Knowledge of General Education information will improve Description Including pain rating scale, medication(s)/side effects and non-pharmacologic comfort measures Outcome: Progressing   Problem: Activity: Goal: Risk for activity intolerance will decrease Outcome: Progressing   Problem: Safety: Goal: Ability to remain free from injury will improve Outcome: Progressing

## 2024-03-03 NOTE — Progress Notes (Signed)
 PROGRESS NOTE    Craig Banks  VWU:[id number] DOB: [birth year] DOA: 02/26/2024 PCP: Marcina Severe, PA-C  Chief Complaint  Patient presents with   [facility] Course:  Craig Banks is 58 y.o. male with insulin -dependent diabetes, who presented with mechanical fall while trying to catch a dog and subsequently sustained a right femur fracture.  Fracture confirmed by x-ray in the ED.  Labs and vitals mostly unremarkable except for hyperglycemia 253.  Patient received Dilaudid  and orthopedic surgery was consulted.  CT scan was ordered but patient reported he was unable to tolerate  Subjective: No acute events overnight.  Patient reports upon waking this morning he has had significant improvement in his right leg pain and mobility.  He demonstrates he is now able to bend it and rotate it.  Objective: Vitals:   03/02/24 0739 03/02/24 1540 03/02/24 1958 03/03/24 0509  BP: 118/67 94/70 (!) 103/47 135/71  Pulse: (!) 103 (!) 126 (!) 54 (!) 104  Resp: 20 18 18 19   Temp: 98.4 F (36.9 C) 98.6 F (37 C) 98.2 F (36.8 C) 98.3 F (36.8 C)  TempSrc:   Oral Oral  SpO2: 95% 95% 96% 97%  Weight:      Height:        Intake/Output Summary (Last 24 hours) at 03/03/2024 2956 Last data filed at 03/02/2024 1500 Gross per 24 hour  Intake 360 ml  Output --  Net 360 ml   Filed Weights   02/28/24 0554  Weight: 77.5 kg    Examination: General exam: Appears calm and comfortable, NAD  Respiratory system: No work of breathing, symmetric chest wall expansion Cardiovascular system: S1 & S2 heard, RRR.  Gastrointestinal system: Abdomen is nondistended, soft and nontender.  Neuro: Alert and oriented. No focal neurological deficits. Extremities: Right leg with postoperative bandages, clean/dry/intact.  Some surrounding ecchymosis.  Swelling improving.  Diffusely tender to palpation Psychiatry: Demonstrates appropriate judgement and insight. Mood & affect appropriate for situation.    Assessment & Plan:  Principal Problem:   Femur fracture, right (HCC) Active Problems:   Closed displaced intertrochanteric fracture of right femur (HCC)    Right proximal femur intertrochanteric  fracture - 4/17 reduction and internal fixation of displaced four-part intertrochanteric right hip fracture. - No concerns of pathologic fracture per orthopedic surgery - Continue physical therapy and Occupational Therapy. - Pain appears out of proportion to expectation.  Repeat CT scan ordered, mostly unremarkable. - Continue to encourage out of bed, early mobilization, and attempted weightbearing on left leg - Continue with scheduled Toradol , as needed Oxy and Tylenol .  Continues to request medications around-the-clock.  Will monitor and titrate - Recent right rotator cuff repair 2/17 and has limited lifting or weightbearing on right upper extremity.  Should be able to pivot with independent transfers on left leg. - Given his current level of care for skilled nursing facility.  TOC has been consulted and is searching for bed offers  DVT right lower extremity - Doppler confirms acute DVT in right cheerier tibial and peroneal vein.  This was first observed by anesthesiologist in the preoperative period.  I do suspect that it was present on arrival. - Currently on loading dose of Eliquis , will continue.  Will need Eliquis  for at least 3 months   Normocytic anemia - Downtrending hemoglobin. - No obvious concern of hematoma or compartment syndrome.  Repeat CT scan pending as above - Some anemia may be secondary to DVT and intraoperative blood loss though  reported less than 100 EBL. -No occult bleeding.  Does have leg swelling.  Right ankle swelling - X-ray without acute fracture.  Does note effusion and heterotopic ossification, suspected from old injury - Swelling is improved  Uncontrolled Type 1 diabetes with hyperglycemia - Hemoglobin A1c 8.2% - Patient has CGM and prefers to follow his  own sliding scale.  Continue to adjust accordingly - Resume home dose Lantus  twice daily - Patient reports he has recently been eating poorly contributing to his higher A1c.  Reports he has previously been closer to 7%  Anxiety Depression - Resume home meds  DVT prophylaxis: Eliquis  Disposition: Medically stable for discharge.  Entirely on p.o. medications.  Pending rehab placement.  TOC consulted and working on it. Consultants:  Treatment Team:  Consulting Physician: Venus Ginsberg, MD  Procedures:    Antimicrobials:  Anti-infectives (From admission, onward)    Start     Dose/Rate Route Frequency Ordered Stop   02/27/24 1800  ceFAZolin  (ANCEF ) IVPB 2g/100 mL premix        2 g 200 mL/hr over 30 Minutes Intravenous Every 6 hours 02/27/24 1553 02/28/24 1425   02/27/24 1229  ceFAZolin  (ANCEF ) IVPB 2g/100 mL premix        2 g 200 mL/hr over 30 Minutes Intravenous 30 min pre-op 02/27/24 1229 02/27/24 1318       Data Reviewed: I have personally reviewed following labs and imaging studies CBC: Recent Labs  Lab 02/26/24 1636 02/27/24 0942 02/28/24 0434 02/29/24 0449 03/01/24 0848  WBC 9.6 9.8 10.6* 8.0 8.8  NEUTROABS 7.2 6.2  --   --  6.0  HGB 13.5 12.3* 9.5* 9.1* 8.9*  HCT 40.3 36.1* 27.9* 26.4* 26.2*  MCV 90.6 90.9 91.5 90.4 87.9  PLT 269 217 174 163 196   Basic Metabolic Panel: Recent Labs  Lab 02/26/24 1636 02/27/24 0942 02/28/24 0434 03/01/24 0848  NA 137 137 132* 132*  K 4.9 4.5 4.6 4.1  CL 105 105 102 99  CO2 25 28 23 26   GLUCOSE 253* 145* 308* 196*  BUN 19 20 21* 15  CREATININE 1.01 0.95 0.87 0.88  CALCIUM  8.7* 8.4* 7.9* 8.0*   GFR: Estimated Creatinine Clearance: 86.6 mL/min (by C-G formula based on SCr of 0.88 mg/dL). Liver Function Tests: Recent Labs  Lab 02/27/24 0942 03/01/24 0848  AST 34 42*  ALT 25 39  ALKPHOS 51 97  BILITOT 0.9 1.4*  PROT 5.4* 5.4*  ALBUMIN  3.1* 2.5*   CBG: Recent Labs  Lab 03/02/24 0735 03/02/24 1200  03/02/24 1744 03/02/24 2152 03/03/24 0728  GLUCAP 245* 180* 189* 136* 184*    Recent Results (from the past 240 hours)  Surgical PCR screen     Status: None   Collection Time: 02/27/24  4:04 AM   Specimen: Nasal Mucosa; Nasal Swab  Result Value Ref Range Status   MRSA, PCR NEGATIVE NEGATIVE Final   Staphylococcus aureus NEGATIVE NEGATIVE Final    Comment: (NOTE) The Xpert SA Assay (FDA approved for NASAL specimens in patients 19 years of age and older), is one component of a comprehensive surveillance program. It is not intended to diagnose infection nor to guide or monitor treatment. Performed at [facility], [address] [place], [place] [address]      Radiology Studies: No results found.   Scheduled Meds:  apixaban   10 mg Oral BID   Followed by   Cecily Cohen ON 03/06/2024] apixaban   5 mg Oral BID   aspirin  EC  81  mg Oral Daily   insulin  aspart  0-20 Units Subcutaneous TID WC   insulin  aspart  0-5 Units Subcutaneous QHS   insulin  glargine-yfgn  15 Units Subcutaneous BID   ketorolac   30 mg Intravenous Q6H   lactulose   10 g Oral BID   polyethylene glycol  17 g Oral BID   traZODone   100 mg Oral QHS   Continuous Infusions:     LOS: 6 days  MDM: Patient is high risk for one or more organ failure.  They necessitate ongoing hospitalization for continued IV therapies and subsequent lab monitoring. Total time spent interpreting labs and vitals, coordinating care amongst consultants and care team members, directly assessing and discussing care with the patient and/or family: 55 min    Craig Baggerly, DO [facility]  To contact the attending physician between 7A-7P please use Epic Chat. To contact the covering physician during after hours 7P-7A, please review Amion.   03/03/2024, 8:26 AM   *This document has been created with the assistance of dictation software. Please excuse typographical errors. *

## 2024-03-04 DIAGNOSIS — S72391A Other fracture of shaft of right femur, initial encounter for closed fracture: Secondary | ICD-10-CM | POA: Diagnosis not present

## 2024-03-04 LAB — CBC WITH DIFFERENTIAL/PLATELET
Abs Immature Granulocytes: 0.05 10*3/uL (ref 0.00–0.07)
Basophils Absolute: 0 10*3/uL (ref 0.0–0.1)
Basophils Relative: 0 %
Eosinophils Absolute: 0.8 10*3/uL — ABNORMAL HIGH (ref 0.0–0.5)
Eosinophils Relative: 11 %
HCT: 24 % — ABNORMAL LOW (ref 39.0–52.0)
Hemoglobin: 8.3 g/dL — ABNORMAL LOW (ref 13.0–17.0)
Immature Granulocytes: 1 %
Lymphocytes Relative: 17 %
Lymphs Abs: 1.3 10*3/uL (ref 0.7–4.0)
MCH: 31 pg (ref 26.0–34.0)
MCHC: 34.6 g/dL (ref 30.0–36.0)
MCV: 89.6 fL (ref 80.0–100.0)
Monocytes Absolute: 1.1 10*3/uL — ABNORMAL HIGH (ref 0.1–1.0)
Monocytes Relative: 15 %
Neutro Abs: 4.1 10*3/uL (ref 1.7–7.7)
Neutrophils Relative %: 56 %
Platelets: 258 10*3/uL (ref 150–400)
RBC: 2.68 MIL/uL — ABNORMAL LOW (ref 4.22–5.81)
RDW: 13.2 % (ref 11.5–15.5)
WBC: 7.4 10*3/uL (ref 4.0–10.5)
nRBC: 0 % (ref 0.0–0.2)

## 2024-03-04 LAB — PHOSPHORUS: Phosphorus: 3.3 mg/dL (ref 2.5–4.6)

## 2024-03-04 LAB — COMPREHENSIVE METABOLIC PANEL WITH GFR
ALT: 32 U/L (ref 0–44)
AST: 23 U/L (ref 15–41)
Albumin: 2.6 g/dL — ABNORMAL LOW (ref 3.5–5.0)
Alkaline Phosphatase: 158 U/L — ABNORMAL HIGH (ref 38–126)
Anion gap: 7 (ref 5–15)
BUN: 19 mg/dL (ref 6–20)
CO2: 25 mmol/L (ref 22–32)
Calcium: 7.9 mg/dL — ABNORMAL LOW (ref 8.9–10.3)
Chloride: 101 mmol/L (ref 98–111)
Creatinine, Ser: 0.85 mg/dL (ref 0.61–1.24)
GFR, Estimated: >60 mL/min (ref >60)
Glucose, Bld: 220 mg/dL — ABNORMAL HIGH (ref 70–99)
Potassium: 4.1 mmol/L (ref 3.5–5.1)
Sodium: 133 mmol/L — ABNORMAL LOW (ref 135–145)
Total Bilirubin: 1.4 mg/dL — ABNORMAL HIGH (ref 0.0–1.2)
Total Protein: 5.3 g/dL — ABNORMAL LOW (ref 6.5–8.1)

## 2024-03-04 LAB — GLUCOSE, CAPILLARY
Glucose-Capillary: 259 mg/dL — ABNORMAL HIGH (ref 70–99)
Glucose-Capillary: 385 mg/dL — ABNORMAL HIGH (ref 70–99)
Glucose-Capillary: 273 mg/dL — ABNORMAL HIGH (ref 70–99)

## 2024-03-04 LAB — MAGNESIUM: Magnesium: 2 mg/dL (ref 1.7–2.4)

## 2024-03-04 MED ORDER — INSULIN ASPART 100 UNIT/ML IJ SOLN
4.0000 [IU] | Freq: Every day | INTRAMUSCULAR | Status: DC
  Administered 2024-03-04 – 2024-03-05 (×2): 4 [IU] via SUBCUTANEOUS
  Filled 2024-03-04 (×2): qty 1

## 2024-03-04 MED ORDER — INSULIN ASPART 100 UNIT/ML IJ SOLN
5.0000 [IU] | Freq: Every day | INTRAMUSCULAR | Status: DC
  Administered 2024-03-05 – 2024-03-06 (×2): 5 [IU] via SUBCUTANEOUS
  Filled 2024-03-04 (×2): qty 1

## 2024-03-04 MED ORDER — INSULIN ASPART 100 UNIT/ML IJ SOLN: 4.0000 [IU] | Freq: Three times a day (TID) | INTRAMUSCULAR | Status: DC

## 2024-03-04 MED ORDER — INSULIN ASPART 100 UNIT/ML IJ SOLN
7.0000 [IU] | Freq: Every day | INTRAMUSCULAR | Status: DC
  Administered 2024-03-04 – 2024-03-05 (×2): 7 [IU] via SUBCUTANEOUS
  Filled 2024-03-04 (×2): qty 1

## 2024-03-04 MED ORDER — INSULIN ASPART 100 UNIT/ML IJ SOLN
0.0000 [IU] | Freq: Three times a day (TID) | INTRAMUSCULAR | Status: DC
  Administered 2024-03-04: 5 [IU] via SUBCUTANEOUS
  Administered 2024-03-04: 9 [IU] via SUBCUTANEOUS
  Administered 2024-03-05: 0 [IU] via SUBCUTANEOUS
  Administered 2024-03-05 (×2): 3 [IU] via SUBCUTANEOUS
  Administered 2024-03-06: 2 [IU] via SUBCUTANEOUS
  Filled 2024-03-04 (×5): qty 1

## 2024-03-04 NOTE — TOC Progression Note (Signed)
 Transition of Care [facility]) - Progression Note    Patient Details  Name: Craig Banks MRN: [id number] Date of Birth: 1966-03-07  [facility] CM/SW Contact  Alexandra Ice, RN Phone Number: 03/04/2024, 12:35 PM  Clinical Narrative:    Met patient at bedside to discuss bed offers, he stated he was not going to [place] or [place]. He wanted to know why this was not being covered under his Worker's Comp claim, asked he followed up with the claim. He stated he has been in hospital and unable do so, and said 'I thought it was your job to figure that stuff out for me." TOC CM explained he needed to ensure the claim was filed. He stated he was not going to pay for any this and he did not understand why he could not go to [facility]. TOC CM explained process of bedsearch, he was not receptive to listening. He asked if TOC CM would contact his manager, provided his contact information.  Spoke with Polly Brink and Ray Caffey, they explained that they provided information regarding to incident but they do not any information as an update, but they would provide my contact information to [facility] and [employer].  Received call from [facility], and she provided worker's comp claim number, [id number], and the claim adjuster information. Baldemar Bond, [phone].  Attempted to contact Galvin Jules, [phone], to discuss patient's discharge needs. Received voicemail, left message requesting call back, and provided contact number and office hours.    Expected Discharge Plan: Home w Home Health Services Barriers to Discharge: Continued Medical Work up  Expected Discharge Plan and Services       Living arrangements for the past 2 months: Single Family Home                                       Social Determinants of Health (SDOH) Interventions SDOH Screenings   Food Insecurity: No Food Insecurity (02/27/2024)  Housing: Low Risk  (02/27/2024)  Transportation Needs:  No Transportation Needs (02/27/2024)  Utilities: Not At Risk (02/27/2024)  Tobacco Use: Low Risk  (02/27/2024)    Readmission Risk Interventions     No data to display

## 2024-03-04 NOTE — Inpatient Diabetes Management (Addendum)
 Inpatient Diabetes Program Recommendations  AACE/ADA: New Consensus Statement on Inpatient Glycemic Control   Target Ranges:  Prepandial:   less than 140 mg/dL      Peak postprandial:   less than 180 mg/dL (1-2 hours)      Critically ill patients:  140 - 180 mg/dL    Latest Reference Range & Units 03/03/24 07:28 03/03/24 11:44 03/03/24 17:00 03/03/24 21:25  Glucose-Capillary 70 - 99 mg/dL 782 (H) 956 (H) 213 (H) 169 (H)   Review of Glycemic Control  Diabetes history: DM1 Outpatient Diabetes medications: Lantus  15 units at bedtime, Humalog  5-7 units TID with meals, FSL3 CGM Current orders for Inpatient glycemic control: Semglee  15 units BID, Novolog  0-20 units TID with meals, Novolog  0-5 units QHS  Inpatient Diabetes Program Recommendations:    Insulin : Please consider decreasing Novolog  correction to 0-9 units TID and adding Novolog  5 units with breakfast, Novolog  4 units with lunch, and Novolog  7 units with supper for meal coverage if patient eats at least 50% of meals (meal coverage dosages as patient has requested).  Addendum 03/04/24@10 :20-Received chat message that patient was upset about how insulin  is ordered currently which is different than his outpatient insulin  regimen. Called patient over the phone to discuss his concerns. Patient states at home he takes Humalog  5 units for breakfast plus correction, at lunch he takes correction if not eating or Humalog  4-5 units with lunch, and at supper he takes Humalog  7 units for supper plus correction. Discussed that Novolog  correction scale decreased this morning and Novolog  4 units TID ordered for meal coverage. Patient states he would prefer to get Novolog  5 units with breakfast, Novolog  4 units with lunch, and Novolog  7 units with supper plus correction TID depending on glucose. Patient reports that he is using his FSL3 CGM and that it is running fairly close to what finger stick glucose has been (within 10-20 points). Informed patient I  would reach back out to Dr. Gordy Lauber to ask if meal coverage orders could be adjusted as he has requested. Patient appreciative of information discussed and he has no further questions or concerns.  Thanks, Beacher Limerick, RN, MSN, CDCES Diabetes Coordinator Inpatient Diabetes Program [phone] (Team Pager from 8am to 5pm)

## 2024-03-04 NOTE — Progress Notes (Addendum)
  PROGRESS NOTE    Craig Banks  YSA:[id number] DOB: 05/07/1966 DOA: 02/26/2024 PCP: Marcina Severe, PA-C  142A/[id number]-AA  LOS: 7 days   Brief hospital course:   Assessment & Plan: Craig Banks is 58 y.o. male with insulin -dependent diabetes, who presented with mechanical fall while trying to catch a dog and subsequently sustained a right femur fracture.  Fracture confirmed by x-ray in the ED.  Labs and vitals mostly unremarkable except for hyperglycemia 253.  Patient received Dilaudid  and orthopedic surgery was consulted.  CT scan was ordered but patient reported he was unable to tolerate    Right proximal femur intertrochanteric fracture S/p reduction and internal fixation of displaced four-part intertrochanteric right hip fracture on 02/27/24 - No concerns of pathologic fracture per orthopedic surgery - Pain was out of proportion to expectation.  Repeat CT scan ordered, mostly unremarkable. --cont scheduled Toradol , oxy PRN --PT/OT, SNF rehab  Recent right rotator cuff repair 2/17  --has limited lifting or weightbearing on right upper extremity.     DVT right lower extremity - Doppler confirms acute DVT in right cheerier tibial and peroneal vein.  This was first observed by anesthesiologist in the preoperative period.  suspect that it was present on arrival. --cont Eliquis    Normocytic anemia - Downtrending hemoglobin. - No obvious concern of hematoma or compartment syndrome.     Right ankle swelling - X-ray without acute fracture.  Does note effusion and heterotopic ossification, suspected from old injury - Swelling is improved   Uncontrolled Type 1 diabetes with hyperglycemia - Hemoglobin A1c 8.2%.  Patient reports he has recently been eating poorly contributing to his higher A1c.  Reports he has previously been closer to 7% - Patient has CGM and prefers to follow his own sliding scale.   --cont glargine 15u BID   Anxiety Depression --cont  trazodone   Hyponatremia --of unclear significance   DVT prophylaxis: On:Eliquis  Code Status: Full code  Family Communication:  Level of care: Med-Surg Dispo:   The patient is from: home Anticipated d/c is to: SNF rehab Anticipated d/c date is: whenever bed available   Subjective and Interval History:  Still having pain but much improved since the weekend.   Objective: Vitals:   03/03/24 2045 03/04/24 0221 03/04/24 0719 03/04/24 1532  BP: 133/64 (!) 143/60 (!) 141/83 116/72  Pulse: (!) 55 (!) 54 (!) 104 (!) 110  Resp: 19 18 18 16   Temp: 98.6 F (37 C) 98.8 F (37.1 C) 98.5 F (36.9 C) 98.4 F (36.9 C)  TempSrc: Oral Oral Oral   SpO2: 100% 95% 94% 99%  Weight:      Height:        Intake/Output Summary (Last 24 hours) at 03/04/2024 1730 Last data filed at 03/03/2024 1900 Gross per 24 hour  Intake 0 ml  Output --  Net 0 ml   Filed Weights   02/28/24 0554  Weight: 77.5 kg    Examination:   Constitutional: NAD, AAOx3 HEENT: conjunctivae and lids normal, EOMI CV: No cyanosis.   RESP: normal respiratory effort, on RA Neuro: II - XII grossly intact.   Psych: Normal mood and affect.  Appropriate judgement and reason   Data Reviewed: I have personally reviewed labs and imaging studies  Time spent: 35 minutes  Garrison Kanner, MD [facility] If 7PM-7AM, please contact night-coverage 03/04/2024, 5:30 PM

## 2024-03-04 NOTE — Progress Notes (Signed)
 Subjective: 6 Days Post-Op Procedure(s) (LRB): FIXATION, FRACTURE, INTERTROCHANTERIC, WITH INTRAMEDULLARY ROD (Right) Patient reports pain as mild and improving from yesterday.   Patient is well, and has had no acute complaints or problems. Admits to having some nausea, again improving from yesterday. Denies any CP, SOB, vomiting, fevers or chills We will continue therapy today.  Plan is to go SNF after hospital stay.  Objective: Vital signs in last 24 hours: Temp:  [98.5 F (36.9 C)-98.8 F (37.1 C)] 98.5 F (36.9 C) (04/23 0719) Pulse Rate:  [54-104] 104 (04/23 0719) Resp:  [18-19] 18 (04/23 0719) BP: (120-143)/(60-83) 141/83 (04/23 0719) SpO2:  [94 %-100 %] 94 % (04/23 0719)  Intake/Output from previous day:  Intake/Output Summary (Last 24 hours) at 03/04/2024 1121 Last data filed at 03/03/2024 1900 Gross per 24 hour  Intake 0 ml  Output --  Net 0 ml    Intake/Output this shift: No intake/output data recorded.  Labs: Recent Labs    03/03/24 0903 03/04/24 0338  HGB 8.3* 8.3*   Recent Labs    03/03/24 0903 03/04/24 0338  WBC 6.8 7.4  RBC 2.69* 2.68*  HCT 23.9* 24.0*  PLT 229 258   Recent Labs    03/03/24 0903 03/04/24 0338  NA 136 133*  K 4.0 4.1  CL 100 101  CO2 28 25  BUN 22* 19  CREATININE 1.02 0.85  GLUCOSE 211* 220*  CALCIUM  8.2* 7.9*   No results for input(s): "LABPT", "INR" in the last 72 hours.  Recent Imaging: FINDINGS: Bones/Joint/Cartilage   Status post right femoral intramedullary nail and dynamic screw fixation of a comminuted intertrochanteric femur fracture. There is a long intramedullary nail component secured by 2 distal interlocking screws. The hardware appears well positioned without loosening. Mild residual displacement of the comminuted intertrochanteric fracture. No evidence pelvic fracture or dislocation. There are mild degenerative changes of the right hip and within the lower lumbar spine facet joints. Mild  degenerative changes of the right knee with a small joint effusion and small intra-articular loose bodies.   Ligaments   Suboptimally assessed by CT.   Muscles and Tendons   Ill-defined enlargement of the vastus lateralis and intermedius muscles in the proximal to mid thigh with ill-defined fluid and scattered air bubbles. There is inferior extension of this process along the peripheral aspect of the vastus lateralis muscle and iliotibial band with scattered air bubbles. No high density hematoma or other organized fluid collection identified on noncontrast imaging.   Soft tissues   As above, ill-defined fluid and scattered air bubbles throughout the quadriceps musculature and lateral subcutaneous tissues. There is generalized subcutaneous edema. There are postsurgical changes laterally in the right buttocks and proximal thigh with skin staples. No high-density hematoma, other work in eyes fluid collection or unexpected foreign body identified. Diffuse vascular calcifications are noted.   IMPRESSION: 1. Status post ORIF of a comminuted intertrochanteric right femur fracture with mild residual displacement. No evidence of hardware complication. 2. Ill-defined enlargement of the vastus lateralis and intermedius muscles in the proximal to mid thigh with ill-defined fluid and scattered air bubbles. Findings are likely postsurgical. Soft tissue infection not excluded. No high density hematoma or other organized fluid collection identified on noncontrast imaging. 3. Generalized subcutaneous edema. 4. Mild degenerative changes of the right hip, lower lumbar spine and right knee.  EXAM General - Patient is Alert, Appropriate, and Oriented Extremity - Neurologically intact Neurovascular intact Sensation intact distally Intact pulses distally Dorsiflexion/Plantar flexion intact No cellulitis present  Compartment soft, mildly swollen, with dependent ecchymosis along posterior  thigh Dressing - Dressing C/D/I, minimal drainage most notably on proximal honeycomb Motor Function - intact, moving foot and toes well on exam.  Past Medical History:  Diagnosis Date   Diabetes (HCC)    Diabetes mellitus without complication (HCC)    age 58   Erectile dysfunction    Proteinuria 2006    Assessment/Plan: 6 Days Post-Op Procedure(s) (LRB): FIXATION, FRACTURE, INTERTROCHANTERIC, WITH INTRAMEDULLARY ROD (Right) Principal Problem:   Femur fracture, right (HCC) Active Problems:   Closed displaced intertrochanteric fracture of right femur (HCC)  Estimated body mass index is 26.75 kg/m as calculated from the following:   Height as of this encounter: 5' 7.01" (1.702 m).   Weight as of this encounter: 77.5 kg. Advance diet Up with therapy  Patient will continue to work with physical therapy, WBAT to RLE. Encouraged to walk with OT and PT today, improving slowly Labs show last hgb at 8.3, continue to trend Patient also found to have a DVT of RLE, believe this to be pre-surgical issue. Will need to continue eliquis  postoperatively Also had recent R rotator cuff repair, limited weight bearing on RUE. Trapeze over bar in place TOC to help with placement to SNF  DVT prophylaxis: Eliquis  and SCDs Patient will follow-up with [facility] clinic orthopedics in 2 weeks for reevaluation  Wadie Guile, PA-C [facility] 03/04/2024, 11:21 AM

## 2024-03-04 NOTE — Progress Notes (Addendum)
 Physical Therapy Treatment Patient Details Name: Craig Banks MRN: [id number] DOB: 08-21-1966 Today's Date: 03/04/2024   History of Present Illness Craig Banks is a 58 y.o. male with medical history significant of IDDM and recent R rotator cuff surgery; presented with mechanical fall and right femur fracture s/p R femur IM Nailing (WBAT R LE)    PT Comments  Pt was seen 2 separate session this date. He tolerated sitting in recliner for about 3 hours between sessions. Pt does demonstrate improved pain tolerance from observed earlier in this admission however continues to be pain limited overall. Throughout both sessions, pt was re-educated on NWB RUE restrictions. Pt unable/unwilling to maintain even with Max encouragement. Pt was however safely able to exit bed, stand and tolerate ambulation with RW pushing with LUE + occasional use of RUE against recs. He also tolerated AROM to R knee with vcs on importance of continuing to perform previously prescribed HEP exercises to promote strengthening and return in abilities. Ortho PA in room during first session. Acute PT will continue to follow and progress per current POC. Pty was in bed post 2nd session with bed alarm set and all needs within reach.      If plan is discharge home, recommend the following: A little help with walking and/or transfers;A little help with bathing/dressing/bathroom;Assistance with cooking/housework;Help with stairs or ramp for entrance;Assist for transportation     Equipment Recommendations  Other (comment) (ongoing assessment. Pt's has wt bearing restrictions RUE. Will need further encouragement to follow restrictions however if he remains unwilling, will defer to MDs.)       Precautions / Restrictions Precautions Precautions: Fall Recall of Precautions/Restrictions: Impaired Precaution/Restrictions Comments: NWB RUE, ROM ok but not overhead. WBAT R LE Restrictions Weight Bearing Restrictions Per Provider  Order: Yes RUE Weight Bearing Per Provider Order: Non weight bearing RLE Weight Bearing Per Provider Order: Weight bearing as tolerated     Mobility  Bed Mobility Overal bed mobility: Needs Assistance Bed Mobility: Supine to Sit     Supine to sit: Supervision Sit to supine: Supervision   General bed mobility comments: pt was laying with BUEs over his hed in long sitting. reeducated on importance fo adhering to RUE restrictions. Pt did not require physical assistance to safely exit bed. or return to bed after sitting in recliner x ~ 3 hours    Transfers Overall transfer level: Needs assistance Equipment used: Rolling walker (2 wheels) (Max encouragement to use SPC or hemiwalker however pt only willing to use RW) Transfers: Sit to/from Stand Sit to Stand: Contact guard assist, Supervision           General transfer comment: CGA at first progressing to supervision only by the end of 2nd session    Ambulation/Gait Ambulation/Gait assistance: Contact guard assist, Supervision Gait Distance (Feet): 70 Feet Assistive device: Rolling walker (2 wheels) (against recommendations and restrictions) Gait Pattern/deviations: Step-through pattern, Antalgic Gait velocity: decreased     General Gait Details: No LOB to ambulate ~ 6 lap around room. He was unwilling to ambulate into hallway even though easily could have if willing.   Stairs             Wheelchair Mobility     Tilt Bed    Modified Rankin (Stroke Patients Only)       Balance Overall balance assessment: Needs assistance Sitting-balance support: No upper extremity supported, Feet supported Sitting balance-Leahy Scale: Good     Standing balance support: Bilateral upper extremity supported, During  functional activity, Reliant on assistive device for balance Standing balance-Leahy Scale: Fair Standing balance comment: consistent cuing for balance/steadying only to R UE on RW                             Communication Communication Communication: No apparent difficulties  Cognition Arousal: Alert Behavior During Therapy: Restless, Anxious, Impulsive   PT - Cognitive impairments: No apparent impairments                       PT - Cognition Comments: Pt is A an O x4 but struggles to maintain NWB RUE even with max  vcs to avoid using. pt has been educated throughout admission on restriction on RUE but he continues to raise above his head and put wt throughout it. MDs are aware. Following commands: Impaired (unwilling but congitively able to follow if willing) Following commands impaired: Follows one step commands with increased time    Cueing Cueing Techniques: Verbal cues, Tactile cues  Exercises      General Comments General comments (skin integrity, edema, etc.): pt tolerated several exercises to promote strengthening and increased AROM in R knee. pt till remains overall pain limited but is progressing.      Pertinent Vitals/Pain Pain Assessment Pain Assessment: 0-10 Pain Score: 9  Pain Location: R hip/thigh/buttock Pain Descriptors / Indicators: Discomfort Pain Intervention(s): Limited activity within patient's tolerance, Monitored during session, Premedicated before session, Repositioned    Home Living                          Prior Function            PT Goals (current goals can now be found in the care plan section) Acute Rehab PT Goals Patient Stated Goal: rehab then home when safe too Progress towards PT goals: Progressing toward goals    Frequency    7X/week      PT Plan      Co-evaluation     PT goals addressed during session: Mobility/safety with mobility;Balance;Proper use of DME;Strengthening/ROM        AM-PAC PT "6 Clicks" Mobility   Outcome Measure  Help needed turning from your back to your side while in a flat bed without using bedrails?: A Little Help needed moving from lying on your back to sitting on the side of  a flat bed without using bedrails?: A Little Help needed moving to and from a bed to a chair (including a wheelchair)?: A Little Help needed standing up from a chair using your arms (e.g., wheelchair or bedside chair)?: A Little Help needed to walk in hospital room?: A Little Help needed climbing 3-5 steps with a railing? : A Little 6 Click Score: 18    End of Session   Activity Tolerance: Patient tolerated treatment well;Patient limited by pain Patient left: in chair;with call bell/phone within reach;with chair alarm set Nurse Communication: Mobility status PT Visit Diagnosis: Other abnormalities of gait and mobility (R26.89);Pain;History of falling (Z91.81);Difficulty in walking, not elsewhere classified (R26.2) Pain - Right/Left: Right Pain - part of body: Hip     Time: 9811-9147 PT Time Calculation (min) (ACUTE ONLY): 22 min  Charges:    $Gait Training: 8-22 mins $Therapeutic Exercise: 8-22 mins PT General Charges $$ ACUTE PT VISIT: 1 Visit  Chester Costa PTA 03/04/24, 4:23 PM

## 2024-03-05 DIAGNOSIS — S72391A Other fracture of shaft of right femur, initial encounter for closed fracture: Secondary | ICD-10-CM | POA: Diagnosis not present

## 2024-03-05 LAB — GLUCOSE, CAPILLARY
Glucose-Capillary: 204 mg/dL — ABNORMAL HIGH (ref 70–99)
Glucose-Capillary: 230 mg/dL — ABNORMAL HIGH (ref 70–99)
Glucose-Capillary: 115 mg/dL — ABNORMAL HIGH (ref 70–99)
Glucose-Capillary: 165 mg/dL — ABNORMAL HIGH (ref 70–99)

## 2024-03-05 MED ORDER — LANTUS SOLOSTAR 100 UNIT/ML ~~LOC~~ SOPN: 15.0000 [IU] | PEN_INJECTOR | Freq: Two times a day (BID) | SUBCUTANEOUS | Status: DC

## 2024-03-05 MED ORDER — OXYCODONE HCL 5 MG PO TABS
5.0000 mg | ORAL_TABLET | Freq: Four times a day (QID) | ORAL | 0 refills | Status: DC | PRN
Start: 2024-03-05 — End: 2024-06-08

## 2024-03-05 MED ORDER — APIXABAN 5 MG PO TABS: 5.0000 mg | ORAL_TABLET | Freq: Two times a day (BID) | ORAL | Status: DC

## 2024-03-05 NOTE — Progress Notes (Signed)
 PT Cancellation Note  Patient Details Name: Craig Banks MRN: [id number] DOB: 1966/10/14   Cancelled Treatment:     PT attempt 3 x this date.First attempt around 9am, " Can you come back at 11am?" When author returned at 11am,pt sleeping soundly. On 3rd attempt, Pt endorses that he is going to rehab and did not want to participate in therapy at this time.Acute PT will continue to follow and progress as per current POC   Koleen Perna 03/05/2024, 4:18 PM

## 2024-03-05 NOTE — Progress Notes (Signed)
 Subjective: 7 Days Post-Op Procedure(s) (LRB): FIXATION, FRACTURE, INTERTROCHANTERIC, WITH INTRAMEDULLARY ROD (Right) Patient reports pain as mild and continuing to improve. States his strength feels better as well. Patient is well, and has had no acute complaints or problems. Admits to having improvement of nausea, did not take anything for it today Denies any CP, SOB, vomiting, fevers or chills We will continue therapy today.  Plan is to go SNF after hospital stay.  Objective: Vital signs in last 24 hours: Temp:  [98.2 F (36.8 C)-98.7 F (37.1 C)] 98.3 F (36.8 C) (04/24 0740) Pulse Rate:  [54-110] 98 (04/24 0740) Resp:  [16-20] 16 (04/24 0740) BP: (116-130)/(56-87) 117/87 (04/24 0740) SpO2:  [94 %-100 %] 100 % (04/24 0740)  Intake/Output from previous day:  Intake/Output Summary (Last 24 hours) at 03/05/2024 1230 Last data filed at 03/05/2024 1100 Gross per 24 hour  Intake 480 ml  Output --  Net 480 ml    Intake/Output this shift: Total I/O In: 480 [P.O.:480] Out: -   Labs: Recent Labs    03/03/24 0903 03/04/24 0338  HGB 8.3* 8.3*   Recent Labs    03/03/24 0903 03/04/24 0338  WBC 6.8 7.4  RBC 2.69* 2.68*  HCT 23.9* 24.0*  PLT 229 258   Recent Labs    03/03/24 0903 03/04/24 0338  NA 136 133*  K 4.0 4.1  CL 100 101  CO2 28 25  BUN 22* 19  CREATININE 1.02 0.85  GLUCOSE 211* 220*  CALCIUM  8.2* 7.9*   No results for input(s): "LABPT", "INR" in the last 72 hours.  Recent Imaging:  RLE CT on 03/03/2024  FINDINGS: Bones/Joint/Cartilage   Status post right femoral intramedullary nail and dynamic screw fixation of a comminuted intertrochanteric femur fracture. There is a long intramedullary nail component secured by 2 distal interlocking screws. The hardware appears well positioned without loosening. Mild residual displacement of the comminuted intertrochanteric fracture. No evidence pelvic fracture or dislocation. There are mild degenerative  changes of the right hip and within the lower lumbar spine facet joints. Mild degenerative changes of the right knee with a small joint effusion and small intra-articular loose bodies.   Ligaments   Suboptimally assessed by CT.   Muscles and Tendons   Ill-defined enlargement of the vastus lateralis and intermedius muscles in the proximal to mid thigh with ill-defined fluid and scattered air bubbles. There is inferior extension of this process along the peripheral aspect of the vastus lateralis muscle and iliotibial band with scattered air bubbles. No high density hematoma or other organized fluid collection identified on noncontrast imaging.   Soft tissues   As above, ill-defined fluid and scattered air bubbles throughout the quadriceps musculature and lateral subcutaneous tissues. There is generalized subcutaneous edema. There are postsurgical changes laterally in the right buttocks and proximal thigh with skin staples. No high-density hematoma, other work in eyes fluid collection or unexpected foreign body identified. Diffuse vascular calcifications are noted.   IMPRESSION: 1. Status post ORIF of a comminuted intertrochanteric right femur fracture with mild residual displacement. No evidence of hardware complication. 2. Ill-defined enlargement of the vastus lateralis and intermedius muscles in the proximal to mid thigh with ill-defined fluid and scattered air bubbles. Findings are likely postsurgical. Soft tissue infection not excluded. No high density hematoma or other organized fluid collection identified on noncontrast imaging. 3. Generalized subcutaneous edema. 4. Mild degenerative changes of the right hip, lower lumbar spine and right knee.  EXAM General - Patient is Alert, Appropriate, and  Oriented Extremity - Neurologically intact Neurovascular intact Sensation intact distally Intact pulses distally Dorsiflexion/Plantar flexion intact No cellulitis  present Compartment soft, mildly swollen, with dependent ecchymosis along posterior thigh Dressing - Dressing C/D/I, minimal drainage most notably on proximal honeycomb Motor Function - intact, moving foot and toes well on exam.  Past Medical History:  Diagnosis Date   Diabetes (HCC)    Diabetes mellitus without complication (HCC)    age 58   Erectile dysfunction    Proteinuria 2006    Assessment/Plan: 7 Days Post-Op Procedure(s) (LRB): FIXATION, FRACTURE, INTERTROCHANTERIC, WITH INTRAMEDULLARY ROD (Right) Principal Problem:   Femur fracture, right (HCC) Active Problems:   Closed displaced intertrochanteric fracture of right femur (HCC)  Estimated body mass index is 26.75 kg/m as calculated from the following:   Height as of this encounter: 5' 7.01" (1.702 m).   Weight as of this encounter: 77.5 kg. Advance diet Up with therapy  Patient will continue to work with physical therapy, WBAT to RLE. Encouraged to walk with OT and PT today, improving slowly Labs show last hgb at 8.3, stable Patient also found to have a DVT of RLE, believe this to be pre-surgical issue. Will need to continue eliquis  postoperatively Also had recent R rotator cuff repair, limited weight bearing on RUE. Trapeze over bar in place TOC to help with placement to SNF  DVT prophylaxis: Eliquis  and SCDs Patient will follow-up with [facility] clinic orthopedics in 2 weeks for reevaluation  Orthopedics signing off at this juncture and will follow patient's progression. Please feel free to re-order consultation as needed.  Wadie Guile, PA-C [facility] 03/05/2024, 12:30 PM

## 2024-03-05 NOTE — Discharge Summary (Signed)
 Physician Discharge Summary   SANFORD LINDBLAD  male DOB: 14-Jan-1966  WUJ:[id number]  PCP: Marcina Severe, PA-C  Admit date: 02/26/2024 Discharge date: 03/06/2024  Admitted From: home Disposition:  SNF rehab CODE STATUS: Full code  Discharge Instructions     Amb Referral to Nutrition and Diabetic Education   Complete by: As directed    Amb ref to Medical Nutrition Therapy-MNT   Complete by: As directed    Choose the type of MNT and number of hours: Initial MNT:  3 hours   Diet Carb Modified   Complete by: As directed       Hospital Course:  For full details, please see H&P, progress notes, consult notes and ancillary notes.  Briefly,  Craig Banks is 58 y.o. male with DM1, who presented with mechanical fall while trying to catch a dog and subsequently sustained a right femur fracture.    Right proximal femur intertrochanteric fracture S/p reduction and internal fixation of displaced four-part intertrochanteric right hip fracture on 02/27/24 - No concerns of pathologic fracture per orthopedic surgery - Pain was out of proportion to expectation.  Repeat CT scan ordered, mostly unremarkable.  Pain did improve prior to discharge. --received scheduled Toradol , oxy PRN during hospitalization. --PT/OT, SNF rehab --follow-up with [facility] clinic orthopedics in 2 weeks for reevaluation    Recent right rotator cuff repair 2/17  --has limited lifting or weightbearing on right upper extremity.     DVT right lower extremity - Doppler confirmed Acute DVT of the right posterior tibial and peroneal veins.  This was first observed by anesthesiologist in the preoperative period.  Suspect that it was present on arrival. --cont Eliquis    Normocytic anemia - Hgb 13.5 on presentation, downtrended to 8's, but remained stable around 8's. - No obvious concern of hematoma or compartment syndrome.     Right ankle swelling - X-ray without acute fracture.  Did note effusion and  heterotopic ossification, suspected from old injury - Swelling is improved   Uncontrolled Type 1 diabetes with hyperglycemia - Hemoglobin A1c 8.2%.  Patient reports he has recently been eating poorly contributing to his higher A1c.  Reports he has previously been closer to 7% - Patient has CGM and prefers to follow his own sliding scale.   --cont glargine 15u BID   Hyponatremia --intermittently in low 130's.  of unclear significance   Unless noted above, medications under "STOP" list are ones pt was not taking PTA.  Discharge Diagnoses:  Principal Problem:   Femur fracture, right (HCC) Active Problems:   Closed displaced intertrochanteric fracture of right femur (HCC)   30 Day Unplanned Readmission Risk Score    Flowsheet Row ED to Hosp-Admission (Current) from 02/26/2024 in [facility] (1A)  30 Day Unplanned Readmission Risk Score (%) 11.1 Filed at 03/05/2024 1200       This score is the patient's risk of an unplanned readmission within 30 days of being discharged (0 -100%). The score is based on dignosis, age, lab data, medications, orders, and past utilization.   Low:  0-14.9   Medium: 15-21.9   High: 22-29.9   Extreme: 30 and above         Discharge Instructions:  Allergies as of 03/05/2024   No Known Allergies      Medication List     STOP taking these medications    HYDROcodone -acetaminophen  5-325 MG tablet Commonly known as: NORCO/VICODIN   ondansetron  4 MG disintegrating tablet Commonly known as: ZOFRAN -ODT  traMADol -acetaminophen  37.5-325 MG tablet Commonly known as: Ultracet    traZODone  100 MG tablet Commonly known as: DESYREL    Viagra  100 MG tablet Generic drug: sildenafil    zolpidem  10 MG tablet Commonly known as: AMBIEN        TAKE these medications    apixaban  5 MG Tabs tablet Commonly known as: ELIQUIS  Take 1 tablet (5 mg total) by mouth 2 (two) times daily.   aspirin  EC 81 MG tablet Take 81 mg  by mouth daily.   FreeStyle Freedom Lite w/Device Kit Use to test four times daily   FreeStyle Libre 3 Sensor Misc USE AS DIRECTED EVERY 14 DAYS   glucose blood test strip Use as instructed   ibuprofen 800 MG tablet Commonly known as: ADVIL Take by mouth every 8 (eight) hours as needed for fever, headache or mild pain (pain score 1-3).   insulin  lispro 100 UNIT/ML injection Commonly known as: HUMALOG  Inject 5-7 Units into the skin 3 (three) times daily with meals. SSI   Insulin  Syringe-Needle U-100 31G X 5/16" 0.3 ML Misc Commonly known as: BD Insulin  Syringe U/F USE AS DIRECTED UP TO SIX INJECTIONS DAILY   Lantus  SoloStar 100 UNIT/ML Solostar Pen Generic drug: insulin  glargine Inject 15 Units into the skin 2 (two) times daily. What changed: when to take this   NONFORMULARY OR COMPOUNDED ITEM Trimix (30/1/10)-(Pap/Phent/PGE)  Test Dose  1ml vial   Qty #3 Refills 0  [facility] [phone] Fax [phone]   NONFORMULARY OR COMPOUNDED ITEM Trimix (30/1/10)-(Pap/Phent/PGE)  Dosage: Inject 0.2 cc per injection   Vial 1ml  Qty #10 Refills 6  [facility] [phone] Fax [phone]   NovoLOG  100 UNIT/ML injection Generic drug: insulin  aspart AS DIRECTED FOR SLIDING SCALE What changed: See the new instructions.   oxyCODONE  5 MG immediate release tablet Commonly known as: Oxy IR/ROXICODONE  Take 1-2 tablets (5-10 mg total) by mouth every 6 (six) hours as needed for severe pain (pain score 7-10). What changed:  how much to take when to take this reasons to take this         Contact information for follow-up providers     Wadie Guile, PA-C Follow up in 2 week(s).   Specialty: Orthopedic Surgery Contact information: [address] [place] [address] [phone]         Marcina Severe, PA-C Follow up.   Specialty: Physician Assistant Contact information: [address] [place]  [address] [phone]              Contact information for after-discharge care     Destination     [facility] .   Service: Skilled Nursing Contact information: [address] [place]  [phone]                     No Known Allergies   The results of significant diagnostics from this hospitalization (including imaging, microbiology, ancillary and laboratory) are listed below for reference.   Consultations:   Procedures/Studies: CT FEMUR RIGHT WO CONTRAST Result Date: 03/03/2024 CLINICAL DATA:  Fracture, femur evaluation of swelling and recent surgery EXAM: CT OF THE LOWER RIGHT EXTREMITY WITHOUT CONTRAST TECHNIQUE: Multidetector CT imaging of the right femur was performed according to the standard protocol. RADIATION DOSE REDUCTION: This exam was performed according to the departmental dose-optimization program which includes automated exposure control, adjustment of the mA and/or kV according to patient size and/or use of iterative reconstruction technique. COMPARISON:  Radiographs 02/27/2024  and 02/26/2024. FINDINGS: Bones/Joint/Cartilage Status post right femoral intramedullary nail and dynamic screw fixation of a comminuted intertrochanteric femur fracture. There is a long intramedullary nail component secured by 2 distal interlocking screws. The hardware appears well positioned without loosening. Mild residual displacement of the comminuted intertrochanteric fracture. No evidence pelvic fracture or dislocation. There are mild degenerative changes of the right hip and within the lower lumbar spine facet joints. Mild degenerative changes of the right knee with a small joint effusion and small intra-articular loose bodies. Ligaments Suboptimally assessed by CT. Muscles and Tendons Ill-defined enlargement of the vastus lateralis and intermedius muscles in the proximal to mid thigh with ill-defined fluid and scattered air bubbles.  There is inferior extension of this process along the peripheral aspect of the vastus lateralis muscle and iliotibial band with scattered air bubbles. No high density hematoma or other organized fluid collection identified on noncontrast imaging. Soft tissues As above, ill-defined fluid and scattered air bubbles throughout the quadriceps musculature and lateral subcutaneous tissues. There is generalized subcutaneous edema. There are postsurgical changes laterally in the right buttocks and proximal thigh with skin staples. No high-density hematoma, other work in eyes fluid collection or unexpected foreign body identified. Diffuse vascular calcifications are noted. IMPRESSION: 1. Status post ORIF of a comminuted intertrochanteric right femur fracture with mild residual displacement. No evidence of hardware complication. 2. Ill-defined enlargement of the vastus lateralis and intermedius muscles in the proximal to mid thigh with ill-defined fluid and scattered air bubbles. Findings are likely postsurgical. Soft tissue infection not excluded. No high density hematoma or other organized fluid collection identified on noncontrast imaging. 3. Generalized subcutaneous edema. 4. Mild degenerative changes of the right hip, lower lumbar spine and right knee. Electronically Signed   By: Elmon Hagedorn M.D.   On: 03/03/2024 09:01   DG Ankle 2 Views Right Result Date: 02/27/2024 CLINICAL DATA:  9965 Edema 782.3.  ICD-9-CM EXAM: RIGHT ANKLE - 2 VIEW COMPARISON:  None Available. FINDINGS: No acute fracture. There is remodeling of the talar dome which may relate to avascular necrosis or advanced degenerative change with flattening of the dome and moderate tibiotalar degenerative arthritis. There is subchondral sclerosis and asymmetric joint space narrowing involving the subtalar facets in keeping with moderate degenerative arthritis involving these articulations, not well profiled on this examination. There is heterotopic  ossification subjacent to the medial malleolus and an ossific excrescence subjacent to the lateral malleolus of unclear significance, possibly posttraumatic in nature. Right ankle effusion is present. Moderate bimalleolar soft tissue swelling noted, more severe laterally. IMPRESSION: 1. Moderate bimalleolar soft tissue swelling, more severe laterally. 2. Moderate tibiotalar and subtalar degenerative arthritis, not well profiled on this examination. 3. Heterotopic ossification involving the hindfoot, not optimally profiled on this examination, possibly posttraumatic in nature. If indicated, this would be better delineated with CT imaging. 4. Right ankle effusion. Electronically Signed   By: Worthy Heads M.D.   On: 02/27/2024 19:49   US  Venous Img Lower Unilateral Right (DVT) Result Date: 02/27/2024 CLINICAL DATA:  Edema for 2 hours EXAM: RIGHT LOWER EXTREMITY VENOUS DOPPLER ULTRASOUND TECHNIQUE: Gray-scale sonography with graded compression, as well as color Doppler and duplex ultrasound were performed to evaluate the lower extremity deep venous systems from the level of the common femoral vein and including the common femoral, femoral, profunda femoral, popliteal and calf veins including the posterior tibial, peroneal and gastrocnemius veins when visible. The superficial great saphenous vein was also interrogated. Spectral Doppler was utilized to evaluate flow at  rest and with distal augmentation maneuvers in the common femoral, femoral and popliteal veins. COMPARISON:  None available FINDINGS: Contralateral Common Femoral Vein: Respiratory phasicity is normal and symmetric with the symptomatic side. No evidence of thrombus. Normal compressibility. Common Femoral Vein: No evidence of thrombus. Normal compressibility, respiratory phasicity and response to augmentation. Saphenofemoral Junction: No evidence of thrombus. Normal compressibility and flow on color Doppler imaging. Profunda Femoral Vein: No evidence of  thrombus. Normal compressibility and flow on color Doppler imaging. Femoral Vein: No evidence of thrombus. Normal compressibility, respiratory phasicity and response to augmentation. Popliteal Vein: No evidence of thrombus. Normal compressibility, respiratory phasicity and response to augmentation. Calf Veins: Intraluminal filling defect with decreased compressibility and flow of the right posterior tibial and peroneal veins consistent with acute DVT. Superficial Great Saphenous Vein: No evidence of thrombus. Normal compressibility. Venous Reflux:  None. Other Findings:  None. IMPRESSION: Acute DVT of the right posterior tibial and peroneal veins. Electronically Signed   By: Elester Grim M.D.   On: 02/27/2024 17:31   DG HIP UNILAT WITH PELVIS 2-3 VIEWS RIGHT Result Date: 02/27/2024 CLINICAL DATA:  Elective surgery. Right proximal femoral intramedullary nail surgery. EXAM: DG HIP (WITH OR WITHOUT PELVIS) 2-3V RIGHT COMPARISON:  Right hip radiographs 02/26/2024 FINDINGS: Images were performed intraoperatively without the presence of a radiologist. Interval long cephalomedullary nail fixation of the previously seen proximal femoral diaphyseal fracture. Improved alignment. Total fluoroscopy images: 4 Total fluoroscopy time: 70 seconds Total dose: Radiation Exposure Index (as provided by the fluoroscopic device): 13.6 mGy air Kerma Please see intraoperative findings for further detail. IMPRESSION: Intraoperative fluoroscopy for right proximal femoral intramedullary nail fixation. Electronically Signed   By: Bertina Broccoli M.D.   On: 02/27/2024 17:00   DG C-Arm 1-60 Min-No Report Result Date: 02/27/2024 Fluoroscopy was utilized by the requesting physician.  No radiographic interpretation.   DG C-Arm 1-60 Min-No Report Result Date: 02/27/2024 Fluoroscopy was utilized by the requesting physician.  No radiographic interpretation.   DG HIP PORT UNILAT WITH PELVIS 1V RIGHT Result Date: 02/26/2024 CLINICAL DATA:   Pain after fall. EXAM: DG HIP (WITH OR WITHOUT PELVIS) 1V PORT RIGHT COMPARISON:  None Available. FINDINGS: Single frontal view of the right hip submitted. Comminuted, displaced and angulated subtrochanteric femur fracture. There is also involvement of the lesser trochanter. Femoral head remains seated. IMPRESSION: Comminuted, displaced and angulated subtrochanteric and intertrochanteric femur fracture. Electronically Signed   By: Chadwick Colonel M.D.   On: 02/26/2024 18:56      Labs: BNP (last 3 results) No results for input(s): "BNP" in the last 8760 hours. Basic Metabolic Panel: Recent Labs  Lab 02/28/24 0434 03/01/24 0848 03/03/24 0903 03/04/24 0338  NA 132* 132* 136 133*  K 4.6 4.1 4.0 4.1  CL 102 99 100 101  CO2 23 26 28 25   GLUCOSE 308* 196* 211* 220*  BUN 21* 15 22* 19  CREATININE 0.87 0.88 1.02 0.85  CALCIUM  7.9* 8.0* 8.2* 7.9*  MG  --   --   --  2.0  PHOS  --   --   --  3.3   Liver Function Tests: Recent Labs  Lab 03/01/24 0848 03/03/24 0903 03/04/24 0338  AST 42* 27 23  ALT 39 35 32  ALKPHOS 97 127* 158*  BILITOT 1.4* 1.1 1.4*  PROT 5.4* 5.2* 5.3*  ALBUMIN  2.5* 2.4* 2.6*   No results for input(s): "LIPASE", "AMYLASE" in the last 168 hours. No results for input(s): "AMMONIA" in the last 168 hours.  CBC: Recent Labs  Lab 02/28/24 0434 02/29/24 0449 03/01/24 0848 03/03/24 0903 03/04/24 0338  WBC 10.6* 8.0 8.8 6.8 7.4  NEUTROABS  --   --  6.0 4.1 4.1  HGB 9.5* 9.1* 8.9* 8.3* 8.3*  HCT 27.9* 26.4* 26.2* 23.9* 24.0*  MCV 91.5 90.4 87.9 88.8 89.6  PLT 174 163 196 229 258   Cardiac Enzymes: No results for input(s): "CKTOTAL", "CKMB", "CKMBINDEX", "TROPONINI" in the last 168 hours. BNP: Invalid input(s): "POCBNP" CBG: Recent Labs  Lab 03/04/24 1206 03/04/24 1631 03/04/24 2140 03/05/24 0738 03/05/24 1147  GLUCAP 259* 385* 273* 204* 230*   D-Dimer No results for input(s): "DDIMER" in the last 72 hours. Hgb A1c No results for input(s): "HGBA1C"  in the last 72 hours. Lipid Profile No results for input(s): "CHOL", "HDL", "LDLCALC", "TRIG", "CHOLHDL", "LDLDIRECT" in the last 72 hours. Thyroid function studies No results for input(s): "TSH", "T4TOTAL", "T3FREE", "THYROIDAB" in the last 72 hours.  Invalid input(s): "FREET3" Anemia work up No results for input(s): "VITAMINB12", "FOLATE", "FERRITIN", "TIBC", "IRON", "RETICCTPCT" in the last 72 hours. Urinalysis    Component Value Date/Time   BILIRUBINUR neg 04/19/2023 0852   PROTEINUR Positive (A) 04/19/2023 0852   UROBILINOGEN 0.2 04/19/2023 0852   NITRITE neg 04/19/2023 0852   LEUKOCYTESUR Negative 04/19/2023 0852   Sepsis Labs Recent Labs  Lab 02/29/24 0449 03/01/24 0848 03/03/24 0903 03/04/24 0338  WBC 8.0 8.8 6.8 7.4   Microbiology Recent Results (from the past 240 hours)  Surgical PCR screen     Status: None   Collection Time: 02/27/24  4:04 AM   Specimen: Nasal Mucosa; Nasal Swab  Result Value Ref Range Status   MRSA, PCR NEGATIVE NEGATIVE Final   Staphylococcus aureus NEGATIVE NEGATIVE Final    Comment: (NOTE) The Xpert SA Assay (FDA approved for NASAL specimens in patients 49 years of age and older), is one component of a comprehensive surveillance program. It is not intended to diagnose infection nor to guide or monitor treatment. Performed at [facility], [address] [place], [place] [address]      Total time spend on discharging this patient, including the last patient exam, discussing the hospital stay, instructions for ongoing care as it relates to all pertinent caregivers, as well as preparing the medical discharge records, prescriptions, and/or referrals as applicable, is 20 minutes.    Garrison Kanner, MD  [facility] 03/05/2024, 1:37 PM

## 2024-03-05 NOTE — TOC Progression Note (Signed)
 Transition of Care [facility]) - Progression Note    Patient Details  Name: RAMBO SARAFIAN MRN: [id number] Date of Birth: 05-Oct-1966  Transition of Care [facility]) CM/SW Contact  Alexandra Ice, RN Phone Number: 03/05/2024, 4:11 PM  Clinical Narrative:     Patient received approval through Worker's Comp for [facility]. Received call from Ivette [facility], they were able to accept patient after 4pm. Set up transportation with [facility]. DC summary and orders sent to [facility] via HUB. Notified patient of discharge plan, answered questions.  4pm received message from Ivette Marks that unable to accept patient because bed did not become available as expected and patient's discharge had to be cancelled. Notified Dr. Gordy Lauber and bedside. Contacted [facility] and rescheduled transport for tomorrow, he is number #4. Updated Dr. Gordy Lauber and nurse that patient is scheduled for transport tomorrow, and is #4 on list.   Expected Discharge Plan: Home w Home Health Services Barriers to Discharge: Continued Medical Work up  Expected Discharge Plan and Services       Living arrangements for the past 2 months: Single Family Home Expected Discharge Date: 03/05/24                                     Social Determinants of Health (SDOH) Interventions SDOH Screenings   Food Insecurity: No Food Insecurity (02/27/2024)  Housing: Low Risk  (02/27/2024)  Transportation Needs: No Transportation Needs (02/27/2024)  Utilities: Not At Risk (02/27/2024)  Tobacco Use: Low Risk  (02/27/2024)    Readmission Risk Interventions     No data to display

## 2024-03-05 NOTE — Progress Notes (Signed)
  PROGRESS NOTE    Craig Banks  VZD:[id number] DOB: May 06, 1966 DOA: 02/26/2024 PCP: Marcina Severe, PA-C  142A/[id number]-AA  LOS: 8 days   Brief hospital course:   Assessment & Plan: Craig Banks is 58 y.o. male with insulin -dependent diabetes, who presented with mechanical fall while trying to catch a dog and subsequently sustained a right femur fracture.  Fracture confirmed by x-ray in the ED.  Labs and vitals mostly unremarkable except for hyperglycemia 253.  Patient received Dilaudid  and orthopedic surgery was consulted.  CT scan was ordered but patient reported he was unable to tolerate    Right proximal femur intertrochanteric fracture S/p reduction and internal fixation of displaced four-part intertrochanteric right hip fracture on 02/27/24 - No concerns of pathologic fracture per orthopedic surgery - Pain was out of proportion to expectation.  Repeat CT scan ordered, mostly unremarkable. --d/c scheduled Toradol , cont oxy PRN --PT/OT, SNF rehab --follow-up with [facility] clinic orthopedics in 2 weeks for reevaluation   Recent right rotator cuff repair 2/17  --has limited lifting or weightbearing on right upper extremity.     DVT right lower extremity - Doppler confirms acute DVT in right cheerier tibial and peroneal vein.  This was first observed by anesthesiologist in the preoperative period.  suspect that it was present on arrival. --cont Eliquis    Normocytic anemia - No obvious concern of hematoma or compartment syndrome.     Right ankle swelling - X-ray without acute fracture.  Does note effusion and heterotopic ossification, suspected from old injury - Swelling is improved   Uncontrolled Type 1 diabetes with hyperglycemia - Hemoglobin A1c 8.2%.  Patient reports he has recently been eating poorly contributing to his higher A1c.  Reports he has previously been closer to 7% - Patient has CGM and prefers to follow his own sliding scale.   --cont glargine 15u  BID --ACHS and SSI  Hyponatremia --of unclear significance   DVT prophylaxis: On:Eliquis  Code Status: Full code  Family Communication:  Level of care: Med-Surg Dispo:   The patient is from: home Anticipated d/c is to: SNF rehab Anticipated d/c date is: whenever bed available, likely tomorrow   Subjective and Interval History:  No new complaint today.   Objective: Vitals:   03/05/24 0504 03/05/24 0504 03/05/24 0740 03/05/24 1524  BP: 127/83 127/83 117/87 120/73  Pulse: 92 94 98 94  Resp: 20 20 16 16   Temp: 98.2 F (36.8 C) 98.2 F (36.8 C) 98.3 F (36.8 C) 98.6 F (37 C)  TempSrc: Oral Oral Oral   SpO2: 97% 96% 100% 98%  Weight:      Height:        Intake/Output Summary (Last 24 hours) at 03/05/2024 1815 Last data filed at 03/05/2024 1100 Gross per 24 hour  Intake 480 ml  Output --  Net 480 ml   Filed Weights   02/28/24 0554  Weight: 77.5 kg    Examination:   Constitutional: NAD, AAOx3 HEENT: conjunctivae and lids normal, EOMI CV: No cyanosis.   RESP: normal respiratory effort, on RA Neuro: II - XII grossly intact.   Psych: Normal mood and affect.     Data Reviewed: I have personally reviewed labs and imaging studies  Time spent: 35 minutes  Garrison Kanner, MD [facility] If 7PM-7AM, please contact night-coverage 03/05/2024, 6:15 PM

## 2024-03-06 DIAGNOSIS — S72391A Other fracture of shaft of right femur, initial encounter for closed fracture: Secondary | ICD-10-CM | POA: Diagnosis not present

## 2024-03-06 LAB — GLUCOSE, CAPILLARY
Glucose-Capillary: 185 mg/dL — ABNORMAL HIGH (ref 70–99)
Glucose-Capillary: 194 mg/dL — ABNORMAL HIGH (ref 70–99)

## 2024-03-06 NOTE — Plan of Care (Signed)
  Problem: Coping: Goal: Ability to adjust to condition or change in health will improve Outcome: Progressing   Problem: Skin Integrity: Goal: Risk for impaired skin integrity will decrease Outcome: Progressing   Problem: Activity: Goal: Risk for activity intolerance will decrease Outcome: Progressing   Problem: Pain Managment: Goal: General experience of comfort will improve and/or be controlled Outcome: Progressing

## 2024-03-06 NOTE — TOC Transition Note (Signed)
 Transition of Care [facility]) - Discharge Note   Patient Details  Name: Craig Banks MRN: [id number] Date of Birth: 1966/09/03  Transition of Care [facility]) CM/SW Contact:  Alexandra Ice, RN Phone Number: 03/06/2024, 8:59 AM   Clinical Narrative:    Confirmed with Ivette Marks at [facility] patient able to discharge today. She stated they are able to accept today, requesting patient to arrive around noon. Notified MD and bedside. Provided room number and number for report. Notified patient about discharge today. Called [facility] and requested pickup for around noon.    Final next level of care: Skilled Nursing Facility Barriers to Discharge: Barriers Resolved   Patient Goals and CMS Choice Patient states their goals for this hospitalization and ongoing recovery are:: get better          Discharge Placement              Patient chooses bed at: [facility] Patient to be transferred to facility by: [facility] Name of family member notified: Patient Patient and family notified of of transfer: 03/05/24  Discharge Plan and Services Additional resources added to the After Visit Summary for                                       Social Drivers of Health (SDOH) Interventions SDOH Screenings   Food Insecurity: No Food Insecurity (02/27/2024)  Housing: Low Risk  (02/27/2024)  Transportation Needs: No Transportation Needs (02/27/2024)  Utilities: Not At Risk (02/27/2024)  Tobacco Use: Low Risk  (02/27/2024)     Readmission Risk Interventions     No data to display

## 2024-03-09 DIAGNOSIS — M75121 Complete rotator cuff tear or rupture of right shoulder, not specified as traumatic: Secondary | ICD-10-CM | POA: Diagnosis not present

## 2024-03-09 DIAGNOSIS — E1065 Type 1 diabetes mellitus with hyperglycemia: Secondary | ICD-10-CM | POA: Diagnosis not present

## 2024-03-09 DIAGNOSIS — S7292XA Unspecified fracture of left femur, initial encounter for closed fracture: Secondary | ICD-10-CM | POA: Diagnosis not present

## 2024-03-09 DIAGNOSIS — I82401 Acute embolism and thrombosis of unspecified deep veins of right lower extremity: Secondary | ICD-10-CM | POA: Diagnosis not present

## 2024-03-19 DIAGNOSIS — E1065 Type 1 diabetes mellitus with hyperglycemia: Secondary | ICD-10-CM | POA: Diagnosis not present

## 2024-03-19 DIAGNOSIS — M75121 Complete rotator cuff tear or rupture of right shoulder, not specified as traumatic: Secondary | ICD-10-CM | POA: Diagnosis not present

## 2024-03-19 DIAGNOSIS — S7292XA Unspecified fracture of left femur, initial encounter for closed fracture: Secondary | ICD-10-CM | POA: Diagnosis not present

## 2024-03-19 DIAGNOSIS — I82401 Acute embolism and thrombosis of unspecified deep veins of right lower extremity: Secondary | ICD-10-CM | POA: Diagnosis not present

## 2024-04-03 ENCOUNTER — Ambulatory Visit: Payer: Self-pay

## 2024-04-10 DIAGNOSIS — Z4889 Encounter for other specified surgical aftercare: Secondary | ICD-10-CM | POA: Diagnosis not present

## 2024-06-08 ENCOUNTER — Encounter: Payer: Self-pay | Admitting: Physician Assistant

## 2024-06-08 ENCOUNTER — Ambulatory Visit: Payer: Self-pay | Admitting: Physician Assistant

## 2024-06-08 VITALS — BP 113/77 | HR 66 | Temp 98.1°F | Resp 16

## 2024-06-08 DIAGNOSIS — S72141D Displaced intertrochanteric fracture of right femur, subsequent encounter for closed fracture with routine healing: Secondary | ICD-10-CM

## 2024-06-08 NOTE — Progress Notes (Signed)
   Subjective: Returned to work evaluation    Patient ID: Craig Banks, male    DOB: [birth year], 58 y.o.   MRN: [id number]  HPI Patient is here today for return to work evaluation.  Patient is status post intertrochanteric fixation to the right femur.  Patient date of injury was 02/26/2004.  Patient is continue physical therapy and is scheduled to see podiatry again on 08/14/2024.  Patient requests from treating orthopedic doctor return back to full duties.  Orthopedic doctor told patient was not at for maximal improvement.  Patient states over the weekend he tried to increase his level of activity and noticed increased pain to the right femur.   Review of Systems Type 1 diabetes    Objective:   Physical Exam BP 113/77  Pulse Rate 66  Temp 98.1 F (36.7 C)  Resp 16  SpO2 96 %  Patient ambulates with atypical gait.  Surgical incision is well-healed without signs of infection.  Patient has mild to moderate focal guarding with palpation of the lateral aspect of the proximal thigh.  Patient is able to actively flex hips and extend fully mild complaint of pain.  Grossly neurovascularly intact.       Assessment & Plan: Closed displaced intertrochanteric fracture of the right femur  Patient advised that he is not at max medical improvement.  Recommend modified duty and follow-up in 1 month.

## 2024-06-08 NOTE — Progress Notes (Signed)
 Evaluation for return to work post op fracture right femur with surgical repair and rehab at SNF prior to d/c home.  Reports receiving full duty note for right leg, but today voices concerns about able to fill essential duties with pain about level 5 right leg pointing to right hip/trochanter area and trouble sleeping as well as stability and balance.  Full duty note right arm post op bicep repair after injury.  Notes from case manager as well as orthopedic for our provider to review and his job duties.

## 2024-06-10 ENCOUNTER — Other Ambulatory Visit: Payer: Self-pay | Admitting: Student

## 2024-06-10 ENCOUNTER — Ambulatory Visit
Admission: RE | Admit: 2024-06-10 | Discharge: 2024-06-10 | Disposition: A | Discharge status: Home / Self Care | Source: Ambulatory Visit | Attending: Student | Admitting: Student

## 2024-06-10 DIAGNOSIS — I82451 Acute embolism and thrombosis of right peroneal vein: Secondary | ICD-10-CM

## 2024-06-10 DIAGNOSIS — M79604 Pain in right leg: Secondary | ICD-10-CM | POA: Diagnosis not present

## 2024-06-10 DIAGNOSIS — I8290 Acute embolism and thrombosis of unspecified vein: Secondary | ICD-10-CM | POA: Diagnosis not present

## 2024-06-10 DIAGNOSIS — R600 Localized edema: Secondary | ICD-10-CM | POA: Diagnosis not present

## 2024-07-09 ENCOUNTER — Encounter: Admitting: Physician Assistant

## 2024-07-10 ENCOUNTER — Ambulatory Visit: Payer: Self-pay

## 2024-07-10 DIAGNOSIS — Z0184 Encounter for antibody response examination: Secondary | ICD-10-CM

## 2024-07-10 DIAGNOSIS — M7541 Impingement syndrome of right shoulder: Secondary | ICD-10-CM | POA: Diagnosis not present

## 2024-07-10 DIAGNOSIS — E109 Type 1 diabetes mellitus without complications: Secondary | ICD-10-CM

## 2024-07-10 DIAGNOSIS — Z0000 Encounter for general adult medical examination without abnormal findings: Secondary | ICD-10-CM

## 2024-07-10 LAB — POCT URINALYSIS DIPSTICK
Bilirubin, UA: NEGATIVE
Blood, UA: NEGATIVE
Glucose, UA: NEGATIVE
Ketones, UA: NEGATIVE
Leukocytes, UA: NEGATIVE
Nitrite, UA: NEGATIVE
Protein, UA: POSITIVE — AB
Spec Grav, UA: 1.01 (ref 1.010–1.025)
Urobilinogen, UA: 0.2 U/dL
pH, UA: 7 (ref 5.0–8.0)

## 2024-07-11 LAB — MICROALBUMIN / CREATININE URINE RATIO
Creatinine, Urine: 162.2 mg/dL
Microalb/Creat Ratio: 10 mg/g{creat} (ref 0–29)
Microalbumin, Urine: 15.6 ug/mL

## 2024-07-11 LAB — CMP12+LP+TP+TSH+6AC+PSA+CBC…
ALT: 15 IU/L (ref 0–44)
AST: 16 IU/L (ref 0–40)
Albumin: 4 g/dL (ref 3.8–4.9)
Alkaline Phosphatase: 115 IU/L (ref 44–121)
BUN/Creatinine Ratio: 16 (ref 9–20)
BUN: 16 mg/dL (ref 6–24)
Basophils Absolute: 0 x10E3/uL (ref 0.0–0.2)
Basos: 0 %
Bilirubin Total: 0.6 mg/dL (ref 0.0–1.2)
Calcium: 9.1 mg/dL (ref 8.7–10.2)
Chloride: 102 mmol/L (ref 96–106)
Chol/HDL Ratio: 3.3 ratio (ref 0.0–5.0)
Cholesterol, Total: 159 mg/dL (ref 100–199)
Creatinine, Ser: 0.99 mg/dL (ref 0.76–1.27)
EOS (ABSOLUTE): 0.6 x10E3/uL — ABNORMAL HIGH (ref 0.0–0.4)
Eos: 7 %
Estimated CHD Risk: <0.5 times avg. (ref 0.0–1.0)
Free Thyroxine Index: 2.2 (ref 1.2–4.9)
GGT: 22 IU/L (ref 0–65)
Globulin, Total: 2.3 g/dL (ref 1.5–4.5)
Glucose: 191 mg/dL — ABNORMAL HIGH (ref 70–99)
HDL: 48 mg/dL (ref >39)
Hematocrit: 43.1 % (ref 37.5–51.0)
Hemoglobin: 13.9 g/dL (ref 13.0–17.7)
Immature Grans (Abs): 0 x10E3/uL (ref 0.0–0.1)
Immature Granulocytes: 0 %
Iron: 73 ug/dL (ref 38–169)
LDH: 166 IU/L (ref 121–224)
LDL Chol Calc (NIH): 95 mg/dL (ref 0–99)
Lymphocytes Absolute: 2 x10E3/uL (ref 0.7–3.1)
Lymphs: 23 %
MCH: 29.2 pg (ref 26.6–33.0)
MCHC: 32.3 g/dL (ref 31.5–35.7)
MCV: 91 fL (ref 79–97)
Monocytes Absolute: 1 x10E3/uL — ABNORMAL HIGH (ref 0.1–0.9)
Monocytes: 11 %
Neutrophils Absolute: 5.1 x10E3/uL (ref 1.4–7.0)
Neutrophils: 59 %
Phosphorus: 4.2 mg/dL — ABNORMAL HIGH (ref 2.8–4.1)
Platelets: 307 x10E3/uL (ref 150–450)
Potassium: 4.4 mmol/L (ref 3.5–5.2)
Prostate Specific Ag, Serum: 0.7 ng/mL (ref 0.0–4.0)
RBC: 4.76 x10E6/uL (ref 4.14–5.80)
RDW: 13.8 % (ref 11.6–15.4)
Sodium: 138 mmol/L (ref 134–144)
T3 Uptake Ratio: 29 % (ref 24–39)
T4, Total: 7.5 ug/dL (ref 4.5–12.0)
TSH: 3.6 u[IU]/mL (ref 0.450–4.500)
Total Protein: 6.3 g/dL (ref 6.0–8.5)
Triglycerides: 83 mg/dL (ref 0–149)
Uric Acid: 3.7 mg/dL — ABNORMAL LOW (ref 3.8–8.4)
VLDL Cholesterol Cal: 16 mg/dL (ref 5–40)
WBC: 8.7 x10E3/uL (ref 3.4–10.8)
eGFR: 88 mL/min/1.73 (ref >59)

## 2024-07-11 LAB — HGB A1C W/O EAG: Hgb A1c MFr Bld: 8.8 % — ABNORMAL HIGH (ref 4.8–5.6)

## 2024-07-15 ENCOUNTER — Encounter: Admitting: Physician Assistant

## 2024-07-15 ENCOUNTER — Encounter: Payer: Self-pay | Admitting: Physician Assistant

## 2024-07-15 ENCOUNTER — Ambulatory Visit: Payer: Self-pay | Admitting: Physician Assistant

## 2024-07-15 VITALS — BP 116/64 | HR 57 | Temp 98.6°F | Resp 16 | Wt 166.0 lb

## 2024-07-15 DIAGNOSIS — Z0000 Encounter for general adult medical examination without abnormal findings: Secondary | ICD-10-CM

## 2024-07-15 MED ORDER — DICLOFENAC SODIUM 1 % EX GEL: 4.0000 g | Freq: Four times a day (QID) | CUTANEOUS | 0 refills | Status: AC

## 2024-07-15 NOTE — Progress Notes (Signed)
 Here for annual physical.  Voices no complaints.

## 2024-07-15 NOTE — Progress Notes (Signed)
 [facility]  ____________________________________________   None    (approximate)  I have reviewed the triage vital signs and the nursing notes.   HISTORY  Chief Complaint No chief complaint on file.   HPI Craig Banks is a 58 y.o. male patient presents for annual physical exam.  Voices complain for left knee pain.  Patient was diagnosed with comminuted, displaced and angular send of subtrochanteric and intertrochanteric fracture of the right femur 4-1/2 months ago.  Patient believes the increased pain to the left knee is secondary to compensating for the right femur fracture.  Patient is followed by orthopedics.         Past Medical History:  Diagnosis Date   Diabetes (HCC)    Diabetes mellitus without complication [facility])    age 40   Erectile dysfunction    Proteinuria 2006    Patient Active Problem List   Diagnosis Date Noted   Closed displaced intertrochanteric fracture of right femur (HCC) 02/26/2024   Femur fracture, right (HCC) 02/26/2024   Pain of right upper arm 11/18/2023   Pain in joint of right shoulder 11/18/2023   Acalculous cholecystitis 02/17/2015   Insulin  dependent diabetes mellitus 02/17/1995    Past Surgical History:  Procedure Laterality Date   CHOLECYSTECTOMY  02/18/15   INTRAMEDULLARY (IM) NAIL INTERTROCHANTERIC Right 02/27/2024   Procedure: FIXATION, FRACTURE, INTERTROCHANTERIC, WITH INTRAMEDULLARY ROD;  Surgeon: Edie Norleen PARAS, MD;  Location: [facility] ORS;  Service: Orthopedics;  Laterality: Right;   SHOULDER SURGERY Left 2010   WRIST SURGERY Right 1995    Prior to Admission medications   Medication Sig Start Date End Date Taking? Authorizing Provider  aspirin  EC 81 MG tablet Take 81 mg by mouth daily.   Yes [provider]  Blood Glucose Monitoring Suppl (FREESTYLE FREEDOM LITE) w/Device KIT Use to test four times daily 08/01/21  Yes Claudene Tanda POUR, PA-C  Continuous Glucose Sensor (FREESTYLE  LIBRE 3 SENSOR) MISC USE AS DIRECTED EVERY 14 DAYS 07/24/23  Yes Claudene Tanda POUR, PA-C  diclofenac  Sodium (VOLTAREN  ARTHRITIS PAIN) 1 % GEL Apply 4 g topically 4 (four) times daily. 07/15/24  Yes Claudene Tanda POUR, PA-C  glucose blood test strip Use as instructed 03/08/20  Yes Claudene Tanda POUR, PA-C  insulin  glargine (LANTUS  SOLOSTAR) 100 UNIT/ML Solostar Pen Inject 15 Units into the skin 2 (two) times daily. 03/05/24  Yes Awanda [place], MD  insulin  lispro (HUMALOG ) 100 UNIT/ML injection Inject 5-7 Units into the skin 3 (three) times daily with meals. SSI   Yes [provider]  Insulin  Syringe-Needle U-100 (BD INSULIN  SYRINGE U/F) 31G X 5/16 0.3 ML MISC USE AS DIRECTED UP TO SIX INJECTIONS DAILY 09/24/23  Yes Ndia Sampath K, PA-C  ibuprofen (ADVIL) 800 MG tablet Take by mouth every 8 (eight) hours as needed for fever, headache or mild pain (pain score 1-3). Patient not taking: Reported on 07/15/2024 12/30/23   [provider]  NOVOLOG  100 UNIT/ML injection AS DIRECTED FOR SLIDING SCALE 09/24/23   Claudene Tanda POUR, PA-C    Allergies Patient has no known allergies.  Family History  Problem Relation Age of Onset   Cancer Mother    Diabetes Sister     Social History Social History   Tobacco Use   Smoking status: Never   Smokeless tobacco: Never  Vaping Use   Vaping status: Never Used  Substance Use Topics   Alcohol use: Yes    Alcohol/week: 0.0 standard drinks of alcohol  Drug use: No    Review of Systems  Constitutional: No fever/chills Eyes: No visual changes. ENT: No sore throat. Cardiovascular: Denies chest pain. Respiratory: Denies shortness of breath. Gastrointestinal: No abdominal pain.  No nausea, no vomiting.  No diarrhea.  No constipation. Genitourinary: Negative for dysuria. Musculoskeletal: Negative for back pain.  Right shoulder and left knee pain. Skin: Negative for rash. Neurological: Negative for headaches, focal weakness or numbness.  Endocrine:  Diabetes ____________________________________________   PHYSICAL EXAM:  VITAL SIGNS:  07/15/2024   2:54 PM    BP 116/64  Pulse Rate 57  Temp 98.6 F (37 C)  Weight 166 lb (75.3 kg)  Resp 16  SpO2 97 %   Constitutional: Alert and oriented. Well appearing and in no acute distress. Eyes: Conjunctivae are normal. PERRL. EOMI. Head: Atraumatic. Nose: No congestion/rhinnorhea. Mouth/Throat: Mucous membranes are moist.  Oropharynx non-erythematous. Neck: No stridor. No cervical spine tenderness to palpation. Hematological/Lymphatic/Immunilogical: No cervical lymphadenopathy. Cardiovascular: Normal rate, regular rhythm. Grossly normal heart sounds.  Good peripheral circulation. Respiratory: Normal respiratory effort.  No retractions. Lungs CTAB. Gastrointestinal: Soft and nontender. No distention. No abdominal bruits. No CVA tenderness. Genitourinary: Deferred Musculoskeletal: No lower extremity tenderness nor edema.  No joint effusions. Neurologic:  Normal speech and language. No gross focal neurologic deficits are appreciated. No gait instability. Skin:  Skin is warm, dry and intact. No rash noted. Psychiatric: Mood and affect are normal. Speech and behavior are normal.  ____________________________________________   LABS         Component Ref Range & Units (hover) 5 d ago 1 yr ago 2 yr ago 3 yr ago 5 yr ago  Color, UA yellow yellow yellow dark yellow Dark Yellow  Clarity, UA clear clear clear clear Clear  Glucose, UA Negative Negative Negative Negative Negative  Bilirubin, UA neg neg neg negative Negative  Ketones, UA neg neg neg negative Negative  Spec Grav, UA 1.010 1.015 1.020 >=1.030 Abnormal  1.025  Blood, UA neg neg neg negative Negative  pH, UA 7.0 6.0 6.0 6.0 6.0  Protein, UA Positive Abnormal  Positive Abnormal  CM Negative Positive Abnormal  CM Positive Abnormal  CM  Comment: trace -+  Urobilinogen, UA 0.2 0.2 0.2 0.2 1.0  Nitrite, UA neg neg neg negative  Negative  Leukocytes, UA Negative Negative Negative Negative Negative  Appearance   dark dark   Odor   none                  Component Ref Range & Units (hover) 5 d ago (07/10/24) 4 mo ago (03/04/24) 4 mo ago (03/04/24) 4 mo ago (03/04/24) 4 mo ago (03/03/24) 4 mo ago (03/03/24) 4 mo ago (03/01/24) 4 mo ago (03/01/24)  Glucose 191 High   220 High  CM  211 High  CM  196 High  CM   Uric Acid 3.7 Low          Comment:            Therapeutic target for gout patients: <6.0  BUN 16  19 R  22 High  R  15 R   Creatinine, Ser 0.99  0.85 R  1.02 R  0.88 R   eGFR 88         BUN/Creatinine Ratio 16         Sodium 138  133 Low  R  136 R  132 Low  R   Potassium 4.4  4.1 R  4.0 R  4.1 R  Chloride 102  101 R  100 R  99 R   Calcium  9.1  7.9 Low  R  8.2 Low  R  8.0 Low  R   Phosphorus 4.2 High  3.3 R, CM        Total Protein 6.3  5.3 Low  R  5.2 Low  R  5.4 Low  R   Albumin  4.0  2.6 Low  R  2.4 Low  R  2.5 Low  R   Globulin, Total 2.3         Bilirubin Total 0.6  1.4 High   1.1  1.4 High    Alkaline Phosphatase 115  158 High  R  127 High  R  97 R   Comment: **Effective July 27, 2024 Alkaline Phosphatase**   reference interval will be changing to:              Age                Male          Male           0 -  5 days         47 - 127       47 - 127           6 - 10 days         29 - 242       29 - 242          11 - 20 days        109 - 357      109 - 357          21 - 30 days         94 - 494       94 - 494           1 -  2 months      149 - 539      149 - 539           3 -  6 months      131 - 452      131 - 452           7 - 11 months      117 - 401      117 - 401   12 months -  6 years       158 - 369      158 - 369           7 - 12 years       150 - 409      150 - 409               13 years       156 - 435       78 - 227               14 years       114 - 375       64 - 161               15 years        88 - 279       56 - 134               16 years        74 - 207  51 -  121               17 years        63 - 161       47 - 113          18 - 20 years        51 - 125       42 - 106          21 - 50 years        47 - 123       41 - 116          51 - 80 years        49 - 135       51 - 125              >80 years        48 - 129       48 - 129  LDH 166         AST 16  23 R  27 R  42 High  R   ALT 15  32 R  35 R  39 R   GGT 22         Iron 73         Cholesterol, Total 159         Triglycerides 83         HDL 48         VLDL Cholesterol Cal 16         LDL Chol Calc (NIH) 95         Chol/HDL Ratio 3.3         Comment:                                   T. Chol/HDL Ratio                                             Men  Women                               1/2 Avg.Risk  3.4    3.3                                   Avg.Risk  5.0    4.4                                2X Avg.Risk  9.6    7.1                                3X Avg.Risk 23.4   11.0  Estimated CHD Risk  < 0.5         Comment: The CHD Risk is based on the T. Chol/HDL ratio. Other factors affect CHD Risk such as hypertension, smoking, diabetes, severe obesity, and family history of premature CHD.  TSH 3.600         T4, Total 7.5  T3 Uptake Ratio 29         Free Thyroxine Index 2.2         Prostate Specific Ag, Serum 0.7         Comment: Roche ECLIA methodology. According to the American Urological Association, Serum PSA should decrease and remain at undetectable levels after radical prostatectomy. The AUA defines biochemical recurrence as an initial PSA value 0.2 ng/mL or greater followed by a subsequent confirmatory PSA value 0.2 ng/mL or greater. Values obtained with different assay methods or kits cannot be used interchangeably. Results cannot be interpreted as absolute evidence of the presence or absence of malignant disease.  WBC 8.7   7.4 R  6.8 R  8.8 R  RBC 4.76   2.68 Low  R  2.69 Low  R  2.98 Low  R  Hemoglobin 13.9   8.3 Low  R  8.3 Low  R  8.9 Low  R  Hematocrit 43.1    24.0 Low  R  23.9 Low  R  26.2 Low  R  MCV 91   89.6 R  88.8 R  87.9 R  MCH 29.2   31.0 R  30.9 R  29.9 R  MCHC 32.3   34.6 R  34.7 R  34.0 R  RDW 13.8   13.2 R  13.1 R  12.8 R  Platelets 307   258 R  229 R  196 R  Neutrophils 59   56 R  61 R  67 R  Lymphs 23         Monocytes 11         Eos 7         Basos 0         Neutrophils Absolute 5.1   4.1 R  4.1 R  6.0 R  Lymphocytes Absolute 2.0   1.3 R  0.9 R  1.1 R  Monocytes Absolute 1.0 High          EOS (ABSOLUTE) 0.6 High          Basophils Absolute 0.0   0.0 R  0.0 R  0.0 R  Immature Granulocytes 0   1 R  1 R  1 R  Immature Grans (Abs) 0.0                       Ref Range & Units (hover) 5 d ago (07/10/24) 4 mo ago (02/26/24) 1 yr ago (04/19/23) 1 yr ago (12/11/22) 2 yr ago (04/24/22) 3 yr ago (04/11/21) 4 yr ago (05/31/20)  Hgb A1c MFr Bld 8.8 High  8.2 High  CM 7.5 High  CM 7.2 High  CM 6.8 High  CM 7.2 High  CM 9.0 High  CM  Comment:          Prediabetes: 5.7 - 6.4          Diabetes: >6.4          Glycemic control for adults with diabetes: <7.0                 ____________________________________________  EKG  Sinus rhythm at 89 bpm.  Frequent ectopic ventricular beats.  Left atrial enlargement. ____________________________________________    ____________________________________________   INITIAL IMPRESSION / ASSESSMENT AND PLAN  As part of my medical decision making, I reviewed the following data within the electronic MEDICAL RECORD NUMBER       No acute findings on physical exam or EKG.  Labs reveal full  glucose control.  Labs sent to endocrinologist for patient's follow-up.  Patient given elastic knee support to use during the day along with Voltaren  gel.  Advised to discuss left knee pain with treating orthopedics.    ____________________________________________   FINAL CLINICAL IMPRESSION(S) / ED DIAGNOSES  @[facility] @   ED Discharge Orders          Ordered    diclofenac  Sodium (VOLTAREN  ARTHRITIS PAIN) 1 %  GEL  4 times daily        07/15/24 1517             Note:  This document was prepared using Dragon voice recognition software and may include unintentional dictation errors.

## 2024-07-20 DIAGNOSIS — M25471 Effusion, right ankle: Secondary | ICD-10-CM | POA: Insufficient documentation

## 2024-07-20 DIAGNOSIS — M76892 Other specified enthesopathies of left lower limb, excluding foot: Secondary | ICD-10-CM | POA: Insufficient documentation

## 2024-08-10 ENCOUNTER — Ambulatory Visit: Payer: Self-pay

## 2024-08-14 NOTE — Progress Notes (Signed)
 Pt presented to have both ears cleaned. Pt ears are free from wax.

## 2024-08-18 ENCOUNTER — Telehealth: Payer: Self-pay

## 2024-08-18 ENCOUNTER — Other Ambulatory Visit: Payer: Self-pay

## 2024-08-18 LAB — RABIES NEUT.ABS TITRAT.(RFFIT)

## 2024-08-18 NOTE — Telephone Encounter (Signed)
  Received the following request from [employer] that a PA for FreeStyle Libre 3 Sensor initiated by his pharmacy.  Completed the PA request, sent supporting documentation & awaiting response from [employer].

## 2024-08-18 NOTE — Progress Notes (Unsigned)
 Reconciled problem list & medication list & obtained information for PA for continuation of Freestyle Libre glucose monitoring system.

## 2024-08-19 ENCOUNTER — Telehealth: Payer: Self-pay | Admitting: Urology

## 2024-08-19 NOTE — Telephone Encounter (Signed)
 Patient called for an update on his prescription refill request. Patient has not heard anything at this time and would like to know if our office is going to authorize it. Patient had not been seen in over a year. Please advise.

## 2024-08-20 NOTE — Telephone Encounter (Signed)
 Pt aware he has not been seen in over a year- LV- 8/24.   He will need an appt for a refill. Offered to schedule an appt. Pt declines at this time.

## 2024-08-27 ENCOUNTER — Encounter: Payer: Self-pay | Admitting: *Deleted

## 2024-08-27 NOTE — Progress Notes (Signed)
 Craig Banks                                          MRN: [id number]   08/27/2024   The [facility] Quality Team Specialist reviewed this patient medical record for the purposes of chart review for care gap closure. The following were reviewed: abstraction for care gap closure-kidney health evaluation for diabetes:eGFR  and uACR.    [facility] Quality Team

## 2024-08-28 DIAGNOSIS — M25571 Pain in right ankle and joints of right foot: Secondary | ICD-10-CM | POA: Insufficient documentation

## 2024-09-07 ENCOUNTER — Telehealth: Payer: Self-pay

## 2024-10-31 ENCOUNTER — Other Ambulatory Visit: Payer: Self-pay | Admitting: Physician Assistant

## 2024-10-31 DIAGNOSIS — E109 Type 1 diabetes mellitus without complications: Secondary | ICD-10-CM

## 2024-11-02 ENCOUNTER — Other Ambulatory Visit: Payer: Self-pay

## 2024-11-02 DIAGNOSIS — E109 Type 1 diabetes mellitus without complications: Secondary | ICD-10-CM

## 2024-11-02 MED ORDER — LANTUS SOLOSTAR 100 UNIT/ML ~~LOC~~ SOPN: 15.0000 [IU] | PEN_INJECTOR | Freq: Two times a day (BID) | SUBCUTANEOUS | 7 refills | Status: AC

## 2024-11-02 NOTE — Telephone Encounter (Signed)
 [facility] called requesting Rx refill for Lantus  Solostar.  Said he's been trying to get the medication.  The last prescription for the medication was filled by Dr. Ellouise Haber  (02/2024) when he was either in the hospital or rehab center.  Refill request was more than likely sent to her.  No refill request was sent electronically or by paper fax to Tanda Sharps, PA-C at [facility].

## 2024-12-02 ENCOUNTER — Other Ambulatory Visit: Payer: Self-pay | Admitting: Physician Assistant

## 2024-12-02 DIAGNOSIS — E109 Type 1 diabetes mellitus without complications: Secondary | ICD-10-CM
